# Patient Record
Sex: Male | Born: 1955 | Race: White | Hispanic: No | Marital: Married | Smoking: Former smoker
Health system: Southern US, Community
[De-identification: ages and names within clinical notes are randomized; demographics above are authoritative.]

## PROBLEM LIST (undated history)

## (undated) DIAGNOSIS — J45909 Unspecified asthma, uncomplicated: Secondary | ICD-10-CM

## (undated) DIAGNOSIS — K219 Gastro-esophageal reflux disease without esophagitis: Secondary | ICD-10-CM

## (undated) DIAGNOSIS — K5792 Diverticulitis of intestine, part unspecified, without perforation or abscess without bleeding: Secondary | ICD-10-CM

## (undated) DIAGNOSIS — G40409 Other generalized epilepsy and epileptic syndromes, not intractable, without status epilepticus: Secondary | ICD-10-CM

## (undated) DIAGNOSIS — C649 Malignant neoplasm of unspecified kidney, except renal pelvis: Secondary | ICD-10-CM

## (undated) DIAGNOSIS — N289 Disorder of kidney and ureter, unspecified: Secondary | ICD-10-CM

## (undated) HISTORY — DX: Disorder of kidney and ureter, unspecified: N28.9

---

## 2005-06-26 ENCOUNTER — Inpatient Hospital Stay: Payer: Self-pay | Admitting: Surgery

## 2005-07-10 ENCOUNTER — Inpatient Hospital Stay: Payer: Self-pay | Admitting: Surgery

## 2005-08-03 ENCOUNTER — Ambulatory Visit: Payer: Self-pay | Admitting: Surgery

## 2007-04-18 ENCOUNTER — Ambulatory Visit: Payer: Self-pay | Admitting: Unknown Physician Specialty

## 2011-04-11 ENCOUNTER — Emergency Department: Payer: Self-pay | Admitting: *Deleted

## 2013-12-05 DIAGNOSIS — K5792 Diverticulitis of intestine, part unspecified, without perforation or abscess without bleeding: Secondary | ICD-10-CM | POA: Insufficient documentation

## 2013-12-05 DIAGNOSIS — G4700 Insomnia, unspecified: Secondary | ICD-10-CM | POA: Insufficient documentation

## 2013-12-05 DIAGNOSIS — J45909 Unspecified asthma, uncomplicated: Secondary | ICD-10-CM | POA: Insufficient documentation

## 2014-01-07 DIAGNOSIS — N401 Enlarged prostate with lower urinary tract symptoms: Secondary | ICD-10-CM

## 2014-01-07 DIAGNOSIS — N138 Other obstructive and reflux uropathy: Secondary | ICD-10-CM | POA: Insufficient documentation

## 2015-01-11 DIAGNOSIS — Z0000 Encounter for general adult medical examination without abnormal findings: Secondary | ICD-10-CM | POA: Insufficient documentation

## 2015-09-01 ENCOUNTER — Other Ambulatory Visit: Payer: Self-pay | Admitting: Physician Assistant

## 2015-09-01 ENCOUNTER — Ambulatory Visit
Admission: RE | Admit: 2015-09-01 | Discharge: 2015-09-01 | Disposition: A | Discharge status: Home / Self Care | Payer: BC Managed Care – PPO | Source: Ambulatory Visit | Attending: Physician Assistant | Admitting: Physician Assistant

## 2015-09-01 DIAGNOSIS — K760 Fatty (change of) liver, not elsewhere classified: Secondary | ICD-10-CM | POA: Diagnosis not present

## 2015-09-01 DIAGNOSIS — M549 Dorsalgia, unspecified: Secondary | ICD-10-CM | POA: Diagnosis present

## 2015-09-01 DIAGNOSIS — D3502 Benign neoplasm of left adrenal gland: Secondary | ICD-10-CM | POA: Insufficient documentation

## 2015-09-01 DIAGNOSIS — R791 Abnormal coagulation profile: Secondary | ICD-10-CM | POA: Insufficient documentation

## 2015-09-01 DIAGNOSIS — N281 Cyst of kidney, acquired: Secondary | ICD-10-CM | POA: Insufficient documentation

## 2015-09-01 DIAGNOSIS — R7989 Other specified abnormal findings of blood chemistry: Secondary | ICD-10-CM

## 2015-09-01 HISTORY — DX: Unspecified asthma, uncomplicated: J45.909

## 2015-09-01 MED ORDER — IOHEXOL 350 MG/ML SOLN
75.0000 mL | Freq: Once | INTRAVENOUS | Status: AC | PRN
  Administered 2015-09-01: 75 mL via INTRAVENOUS

## 2016-11-19 ENCOUNTER — Other Ambulatory Visit: Payer: Self-pay | Admitting: Internal Medicine

## 2016-11-19 DIAGNOSIS — E279 Disorder of adrenal gland, unspecified: Principal | ICD-10-CM

## 2016-11-19 DIAGNOSIS — E278 Other specified disorders of adrenal gland: Secondary | ICD-10-CM

## 2016-11-27 ENCOUNTER — Ambulatory Visit
Admission: RE | Admit: 2016-11-27 | Discharge: 2016-11-27 | Disposition: A | Discharge status: Home / Self Care | Payer: BC Managed Care – PPO | Source: Ambulatory Visit | Attending: Internal Medicine | Admitting: Internal Medicine

## 2016-11-27 DIAGNOSIS — E278 Other specified disorders of adrenal gland: Secondary | ICD-10-CM

## 2016-11-27 DIAGNOSIS — K5730 Diverticulosis of large intestine without perforation or abscess without bleeding: Secondary | ICD-10-CM | POA: Insufficient documentation

## 2016-11-27 DIAGNOSIS — E279 Disorder of adrenal gland, unspecified: Secondary | ICD-10-CM | POA: Insufficient documentation

## 2016-11-27 DIAGNOSIS — I700 Atherosclerosis of aorta: Secondary | ICD-10-CM | POA: Diagnosis not present

## 2016-11-27 DIAGNOSIS — N281 Cyst of kidney, acquired: Secondary | ICD-10-CM | POA: Diagnosis not present

## 2017-01-17 DIAGNOSIS — R739 Hyperglycemia, unspecified: Secondary | ICD-10-CM | POA: Insufficient documentation

## 2017-02-12 ENCOUNTER — Emergency Department: Payer: BC Managed Care – PPO

## 2017-02-12 ENCOUNTER — Encounter: Payer: Self-pay | Admitting: Emergency Medicine

## 2017-02-12 ENCOUNTER — Emergency Department
Admission: EM | Admit: 2017-02-12 | Discharge: 2017-02-12 | Disposition: A | Discharge status: Home / Self Care | Payer: BC Managed Care – PPO | Attending: Emergency Medicine | Admitting: Emergency Medicine

## 2017-02-12 DIAGNOSIS — J45909 Unspecified asthma, uncomplicated: Secondary | ICD-10-CM | POA: Diagnosis not present

## 2017-02-12 DIAGNOSIS — K5792 Diverticulitis of intestine, part unspecified, without perforation or abscess without bleeding: Secondary | ICD-10-CM

## 2017-02-12 DIAGNOSIS — Z79899 Other long term (current) drug therapy: Secondary | ICD-10-CM | POA: Diagnosis not present

## 2017-02-12 DIAGNOSIS — R1030 Lower abdominal pain, unspecified: Secondary | ICD-10-CM | POA: Diagnosis present

## 2017-02-12 DIAGNOSIS — K5732 Diverticulitis of large intestine without perforation or abscess without bleeding: Secondary | ICD-10-CM | POA: Diagnosis not present

## 2017-02-12 DIAGNOSIS — Z87891 Personal history of nicotine dependence: Secondary | ICD-10-CM | POA: Insufficient documentation

## 2017-02-12 HISTORY — DX: Diverticulitis of intestine, part unspecified, without perforation or abscess without bleeding: K57.92

## 2017-02-12 LAB — COMPREHENSIVE METABOLIC PANEL
ALK PHOS: 42 U/L (ref 38–126)
ALT: 29 U/L (ref 17–63)
ANION GAP: 7 (ref 5–15)
AST: 25 U/L (ref 15–41)
Albumin: 4.5 g/dL (ref 3.5–5.0)
BILIRUBIN TOTAL: 0.7 mg/dL (ref 0.3–1.2)
BUN: 15 mg/dL (ref 6–20)
CALCIUM: 9.2 mg/dL (ref 8.9–10.3)
CO2: 27 mmol/L (ref 22–32)
Chloride: 106 mmol/L (ref 101–111)
Creatinine, Ser: 1.08 mg/dL (ref 0.61–1.24)
GFR calc Af Amer: >60 mL/min (ref >60)
Glucose, Bld: 110 mg/dL — ABNORMAL HIGH (ref 65–99)
POTASSIUM: 4.2 mmol/L (ref 3.5–5.1)
Sodium: 140 mmol/L (ref 135–145)
TOTAL PROTEIN: 7.7 g/dL (ref 6.5–8.1)

## 2017-02-12 LAB — URINALYSIS, COMPLETE (UACMP) WITH MICROSCOPIC
BACTERIA UA: NONE SEEN
BILIRUBIN URINE: NEGATIVE
Glucose, UA: NEGATIVE mg/dL
Hgb urine dipstick: NEGATIVE
KETONES UR: 5 mg/dL — AB
LEUKOCYTES UA: NEGATIVE
Nitrite: NEGATIVE
PH: 6 (ref 5.0–8.0)
Protein, ur: NEGATIVE mg/dL
SPECIFIC GRAVITY, URINE: 1.017 (ref 1.005–1.030)
Squamous Epithelial / LPF: NONE SEEN

## 2017-02-12 LAB — CBC
HEMATOCRIT: 47 % (ref 40.0–52.0)
HEMOGLOBIN: 16 g/dL (ref 13.0–18.0)
MCH: 30.9 pg (ref 26.0–34.0)
MCHC: 34 g/dL (ref 32.0–36.0)
MCV: 90.7 fL (ref 80.0–100.0)
Platelets: 198 10*3/uL (ref 150–440)
RBC: 5.18 MIL/uL (ref 4.40–5.90)
RDW: 13.5 % (ref 11.5–14.5)
WBC: 6.6 10*3/uL (ref 3.8–10.6)

## 2017-02-12 LAB — LIPASE, BLOOD: Lipase: 22 U/L (ref 11–51)

## 2017-02-12 MED ORDER — METRONIDAZOLE 500 MG PO TABS
500.0000 mg | ORAL_TABLET | Freq: Once | ORAL | Status: AC
  Administered 2017-02-12: 500 mg via ORAL
  Filled 2017-02-12: qty 1

## 2017-02-12 MED ORDER — SULFAMETHOXAZOLE-TRIMETHOPRIM 800-160 MG PO TABS: 1.0000 | ORAL_TABLET | Freq: Two times a day (BID) | ORAL | 0 refills | Status: AC

## 2017-02-12 MED ORDER — METRONIDAZOLE 500 MG PO TABS
500.0000 mg | ORAL_TABLET | Freq: Three times a day (TID) | ORAL | 0 refills | Status: AC
Start: 2017-02-12 — End: 2017-02-22

## 2017-02-12 MED ORDER — IOPAMIDOL (ISOVUE-300) INJECTION 61%
100.0000 mL | Freq: Once | INTRAVENOUS | Status: AC | PRN
  Administered 2017-02-12: 100 mL via INTRAVENOUS

## 2017-02-12 MED ORDER — SULFAMETHOXAZOLE-TRIMETHOPRIM 800-160 MG PO TABS
1.0000 | ORAL_TABLET | Freq: Once | ORAL | Status: AC
  Administered 2017-02-12: 1 via ORAL
  Filled 2017-02-12: qty 1

## 2017-02-12 NOTE — ED Triage Notes (Signed)
LRQ pain that began on Sunday, and right groin pain, pt had moved furniture the day before, pain has lessened but still present. Denies N,V,D. Appears in NAD.

## 2017-02-12 NOTE — ED Notes (Signed)
Pt presents with right groin/lower abdominal pain since Sunday. States he had fever and chills on Sunday, which was the peak pain day. States it is better, "but something isn't right." Pt has hx of diverticulitis but it usually hurts on left side. Pt alert & oriented with NAD noted.

## 2017-02-12 NOTE — ED Provider Notes (Signed)
[facility] Emergency Department Provider Note  ____________________________________________  Time seen: Approximately 8:39 AM  I have reviewed the triage vital signs and the nursing notes.   HISTORY  Chief Complaint Abdominal Pain and Groin Pain   HPI Jonathon Lee is a 61 y.o. male with a history of asthma and diverticulitis who presents for evaluation of lower abdominal pain. Patient reports 2 days of dull constant right lower quadrant abdominal pain. He reports that the pain initially was very sharp and severe on day 1 located in his entire lower abdomen. Since then the pain is now a 2 out of 10, dull and has migrated to the right lower quadrant. He reports that one day before the pain started he was moving some heavy furniture around. Last BM this morning. No nausea or vomiting. 2 days ago patient had subjective fevers and chills however no longer having those. No dysuria or hematuria. No history of kidney stones. No purulence or swelling in his groin. No prior abdominal surgeries.  Past Medical History:  Diagnosis Date  . Asthma   . Diverticulitis     There are no active problems to display for this patient.   History reviewed. No pertinent surgical history.  Prior to Admission medications   Medication Sig Start Date End Date Taking? Authorizing Provider  albuterol (PROVENTIL HFA;VENTOLIN HFA) 108 (90 Base) MCG/ACT inhaler Inhale 2 puffs into the lungs every 4 (four) hours as needed for wheezing. 01/17/17  Yes [provider]  amitriptyline (ELAVIL) 50 MG tablet Take 50 mg by mouth at bedtime. 01/04/17  Yes [provider]  Fluticasone-Salmeterol (ADVAIR DISKUS) 250-50 MCG/DOSE AEPB Inhale 1 puff into the lungs every 12 (twelve) hours. 01/17/17  Yes [provider]  metroNIDAZOLE (FLAGYL) 500 MG tablet Take 1 tablet (500 mg total) by mouth 3 (three) times daily. 02/12/17 02/22/17  Rudene Re, MD    sulfamethoxazole-trimethoprim (BACTRIM DS,SEPTRA DS) 800-160 MG tablet Take 1 tablet by mouth 2 (two) times daily. 02/12/17 02/22/17  Rudene Re, MD    Allergies Zosyn [piperacillin sod-tazobactam so]  No family history on file.  Social History Social History  Substance Use Topics  . Smoking status: Former Research scientist (life sciences)  . Smokeless tobacco: Never Used  . Alcohol use No    Review of Systems  Constitutional: + fever. Eyes: Negative for visual changes. ENT: Negative for sore throat. Neck: No neck pain  Cardiovascular: Negative for chest pain. Respiratory: Negative for shortness of breath. Gastrointestinal: + RLQ abdominal pain. No vomiting or diarrhea. Genitourinary: Negative for dysuria. Musculoskeletal: Negative for back pain. Skin: Negative for rash. Neurological: Negative for headaches, weakness or numbness. Psych: No SI or HI  ____________________________________________   PHYSICAL EXAM:  VITAL SIGNS: ED Triage Vitals  Enc Vitals Group     BP 02/12/17 0827 127/72     Pulse Rate 02/12/17 0827 82     Resp 02/12/17 0827 17     Temp 02/12/17 0827 98.3 F (36.8 C)     Temp Source 02/12/17 0827 Oral     SpO2 02/12/17 0827 97 %     Weight 02/12/17 0823 213 lb (96.6 kg)     Height 02/12/17 0823 5\' 8"  (1.727 m)     Head Circumference --      Peak Flow --      Pain Score 02/12/17 0823 2     Pain Loc --      Pain Edu? --      Excl. in [place]? --  Constitutional: Alert and oriented. Well appearing and in no apparent distress. HEENT:      Head: Normocephalic and atraumatic.         Eyes: Conjunctivae are normal. Sclera is non-icteric.       Mouth/Throat: Mucous membranes are moist.       Neck: Supple with no signs of meningismus. Cardiovascular: Regular rate and rhythm. No murmurs, gallops, or rubs. 2+ symmetrical distal pulses are present in all extremities. No JVD. Respiratory: Normal respiratory effort. Lungs are clear to auscultation bilaterally. No wheezes,  crackles, or rhonchi.  Gastrointestinal: Soft, ttp over the RLQ, non distended with positive bowel sounds. No rebound or guarding. Genitourinary: No CVA tenderness. Bilateral testicles are descended with no tenderness to palpation, bilateral positive cremasteric reflexes are present, no swelling or erythema of the scrotum. No evidence of inguinal hernia. Musculoskeletal: Nontender with normal range of motion in all extremities. No edema, cyanosis, or erythema of extremities. Neurologic: Normal speech and language. Face is symmetric. Moving all extremities. No gross focal neurologic deficits are appreciated. Skin: Skin is warm, dry and intact. No rash noted. Psychiatric: Mood and affect are normal. Speech and behavior are normal.  ____________________________________________   LABS (all labs ordered are listed, but only abnormal results are displayed)  Labs Reviewed  COMPREHENSIVE METABOLIC PANEL - Abnormal; Notable for the following:       Result Value   Glucose, Bld 110 (*)    All other components within normal limits  URINALYSIS, COMPLETE (UACMP) WITH MICROSCOPIC - Abnormal; Notable for the following:    Color, Urine YELLOW (*)    APPearance CLEAR (*)    Ketones, ur 5 (*)    All other components within normal limits  LIPASE, BLOOD  CBC   ____________________________________________  EKG  none ____________________________________________  RADIOLOGY  CT a/p: Acute colonic diverticulitis. Inflammation involving the sigmoid colon in the lower mid abdomen. No evidence for an abscess collection.  Indeterminate right renal lesion measuring up to 1.7 cm. This structure was hyperdense on a previous noncontrast study and this may represent a proteinaceous or hemorrhagic cyst but indeterminate. This lesion could be more definitively characterized with a pre and post contrast MRI.  Right inguinal hernia.  Hepatic steatosis.  Bilateral renal  cysts. ____________________________________________   PROCEDURES  Procedure(s) performed: None Procedures Critical Care performed:  None ____________________________________________   INITIAL IMPRESSION / ASSESSMENT AND PLAN / ED COURSE   61 y.o. male with a history of asthma and diverticulitis who presents for evaluation of lower abdominal pain x 2 days. Patient well-appearing, in no distress, has normal vital signs, abdomen soft with right lower quadrant tenderness to palpation. Normal GU exam with no evidence of torsion or hernia. Differential diagnoses including appendicitis versus diverticulitis versus kidney stone. Plan for CBC, CMP, lipase, urinalysis, and CT abdomen and pelvis.    _________________________ 11:07 AM on 02/12/2017 -----------------------------------------  CT concerning for acute diverticulitis with no abscess or perforation. Patient was started on Bactrim and Flagyl and prescription to be discharged home on a 10 day course. He remains well appearing. Discussed return precautions with patient. Also discussed recommendations for further monitoring of the cyst seen in his right kidney. I told patient that radiologist recommended an MRI for further investigation and that it can be done by the patient's primary care doctor. Patient will discuss that with his primary care doctor.  Pertinent labs & imaging results that were available during my care of the patient were reviewed by me and considered in my  medical decision making (see chart for details).    ____________________________________________   FINAL CLINICAL IMPRESSION(S) / ED DIAGNOSES  Final diagnoses:  Diverticulitis      NEW MEDICATIONS STARTED DURING THIS VISIT:  New Prescriptions   METRONIDAZOLE (FLAGYL) 500 MG TABLET    Take 1 tablet (500 mg total) by mouth 3 (three) times daily.   SULFAMETHOXAZOLE-TRIMETHOPRIM (BACTRIM DS,SEPTRA DS) 800-160 MG TABLET    Take 1 tablet by mouth 2 (two) times  daily.     Note:  This document was prepared using Dragon voice recognition software and may include unintentional dictation errors.    Alfred Levins, [place], MD 02/12/17 [phone]

## 2017-02-12 NOTE — ED Notes (Signed)
Pt discharged home after verbalizing understanding of discharge instructions; nad noted. 

## 2017-12-16 ENCOUNTER — Inpatient Hospital Stay
Admission: EM | Admit: 2017-12-16 | Discharge: 2017-12-31 | DRG: 330 | Disposition: A | Discharge status: Home Health | Payer: BC Managed Care – PPO | Attending: Surgery | Admitting: Surgery

## 2017-12-16 ENCOUNTER — Encounter: Payer: Self-pay | Admitting: Emergency Medicine

## 2017-12-16 ENCOUNTER — Emergency Department: Payer: BC Managed Care – PPO

## 2017-12-16 ENCOUNTER — Other Ambulatory Visit: Payer: Self-pay

## 2017-12-16 ENCOUNTER — Emergency Department: Payer: BC Managed Care – PPO | Admitting: Anesthesiology

## 2017-12-16 ENCOUNTER — Encounter
Admission: EM | Disposition: A | Discharge status: Home Health | Payer: Self-pay | Source: Home / Self Care | Attending: Surgery

## 2017-12-16 DIAGNOSIS — J45909 Unspecified asthma, uncomplicated: Secondary | ICD-10-CM | POA: Diagnosis present

## 2017-12-16 DIAGNOSIS — Z6832 Body mass index (BMI) 32.0-32.9, adult: Secondary | ICD-10-CM

## 2017-12-16 DIAGNOSIS — J90 Pleural effusion, not elsewhere classified: Secondary | ICD-10-CM | POA: Diagnosis not present

## 2017-12-16 DIAGNOSIS — N289 Disorder of kidney and ureter, unspecified: Secondary | ICD-10-CM | POA: Diagnosis present

## 2017-12-16 DIAGNOSIS — Z87891 Personal history of nicotine dependence: Secondary | ICD-10-CM

## 2017-12-16 DIAGNOSIS — Z7951 Long term (current) use of inhaled steroids: Secondary | ICD-10-CM

## 2017-12-16 DIAGNOSIS — K219 Gastro-esophageal reflux disease without esophagitis: Secondary | ICD-10-CM | POA: Diagnosis present

## 2017-12-16 DIAGNOSIS — R0602 Shortness of breath: Secondary | ICD-10-CM

## 2017-12-16 DIAGNOSIS — K567 Ileus, unspecified: Secondary | ICD-10-CM | POA: Diagnosis not present

## 2017-12-16 DIAGNOSIS — J9811 Atelectasis: Secondary | ICD-10-CM | POA: Diagnosis not present

## 2017-12-16 DIAGNOSIS — Z79899 Other long term (current) drug therapy: Secondary | ICD-10-CM | POA: Diagnosis not present

## 2017-12-16 DIAGNOSIS — K08409 Partial loss of teeth, unspecified cause, unspecified class: Secondary | ICD-10-CM | POA: Diagnosis present

## 2017-12-16 DIAGNOSIS — Z880 Allergy status to penicillin: Secondary | ICD-10-CM

## 2017-12-16 DIAGNOSIS — K5720 Diverticulitis of large intestine with perforation and abscess without bleeding: Principal | ICD-10-CM | POA: Diagnosis present

## 2017-12-16 DIAGNOSIS — E871 Hypo-osmolality and hyponatremia: Secondary | ICD-10-CM | POA: Diagnosis not present

## 2017-12-16 HISTORY — PX: LAPAROTOMY: SHX154

## 2017-12-16 LAB — COMPREHENSIVE METABOLIC PANEL
ALBUMIN: 3.8 g/dL (ref 3.5–5.0)
ALK PHOS: 44 U/L (ref 38–126)
ALT: 17 U/L (ref 17–63)
AST: 19 U/L (ref 15–41)
Anion gap: 9 (ref 5–15)
BUN: 19 mg/dL (ref 6–20)
CALCIUM: 8.5 mg/dL — AB (ref 8.9–10.3)
CO2: 24 mmol/L (ref 22–32)
CREATININE: 1.22 mg/dL (ref 0.61–1.24)
Chloride: 103 mmol/L (ref 101–111)
GFR calc Af Amer: >60 mL/min (ref >60)
GLUCOSE: 129 mg/dL — AB (ref 65–99)
Potassium: 4.1 mmol/L (ref 3.5–5.1)
Sodium: 136 mmol/L (ref 135–145)
Total Bilirubin: 0.8 mg/dL (ref 0.3–1.2)
Total Protein: 7.5 g/dL (ref 6.5–8.1)

## 2017-12-16 LAB — URINALYSIS, COMPLETE (UACMP) WITH MICROSCOPIC
BILIRUBIN URINE: NEGATIVE
GLUCOSE, UA: NEGATIVE mg/dL
HGB URINE DIPSTICK: NEGATIVE
Ketones, ur: 20 mg/dL — AB
Nitrite: NEGATIVE
PH: 6 (ref 5.0–8.0)
Protein, ur: 30 mg/dL — AB
SPECIFIC GRAVITY, URINE: 1.026 (ref 1.005–1.030)

## 2017-12-16 LAB — TYPE AND SCREEN
ABO/RH(D): O POS
ANTIBODY SCREEN: NEGATIVE

## 2017-12-16 LAB — CBC
HCT: 46.4 % (ref 40.0–52.0)
Hemoglobin: 16 g/dL (ref 13.0–18.0)
MCH: 31.6 pg (ref 26.0–34.0)
MCHC: 34.6 g/dL (ref 32.0–36.0)
MCV: 91.5 fL (ref 80.0–100.0)
PLATELETS: 231 10*3/uL (ref 150–440)
RBC: 5.07 MIL/uL (ref 4.40–5.90)
RDW: 13.7 % (ref 11.5–14.5)
WBC: 10.9 10*3/uL — ABNORMAL HIGH (ref 3.8–10.6)

## 2017-12-16 LAB — LIPASE, BLOOD: Lipase: 19 U/L (ref 11–51)

## 2017-12-16 SURGERY — LAPAROTOMY, EXPLORATORY
Anesthesia: General | Site: Abdomen | Wound class: Contaminated

## 2017-12-16 MED ORDER — ONDANSETRON HCL 4 MG/2ML IJ SOLN
4.0000 mg | Freq: Four times a day (QID) | INTRAMUSCULAR | Status: DC | PRN
  Administered 2017-12-17 – 2017-12-22 (×6): 4 mg via INTRAVENOUS
  Filled 2017-12-16 (×7): qty 2

## 2017-12-16 MED ORDER — HYDRALAZINE HCL 20 MG/ML IJ SOLN: 10.0000 mg | INTRAMUSCULAR | Status: DC | PRN

## 2017-12-16 MED ORDER — IOHEXOL 300 MG/ML  SOLN
100.0000 mL | Freq: Once | INTRAMUSCULAR | Status: AC | PRN
  Administered 2017-12-16: 100 mL via INTRAVENOUS

## 2017-12-16 MED ORDER — ENOXAPARIN SODIUM 40 MG/0.4ML ~~LOC~~ SOLN
40.0000 mg | SUBCUTANEOUS | Status: DC
  Administered 2017-12-17 – 2017-12-30 (×14): 40 mg via SUBCUTANEOUS
  Filled 2017-12-16 (×14): qty 0.4

## 2017-12-16 MED ORDER — ROCURONIUM BROMIDE 50 MG/5ML IV SOLN
INTRAVENOUS | Status: AC
  Filled 2017-12-16: qty 1

## 2017-12-16 MED ORDER — LIDOCAINE HCL (CARDIAC) PF 100 MG/5ML IV SOSY
PREFILLED_SYRINGE | INTRAVENOUS | Status: DC | PRN
  Administered 2017-12-16: 60 mg via INTRAVENOUS

## 2017-12-16 MED ORDER — ONDANSETRON HCL 4 MG/2ML IJ SOLN
INTRAMUSCULAR | Status: AC
  Administered 2017-12-16: 4 mg via INTRAVENOUS
  Filled 2017-12-16: qty 2

## 2017-12-16 MED ORDER — METRONIDAZOLE IN NACL 5-0.79 MG/ML-% IV SOLN
500.0000 mg | Freq: Three times a day (TID) | INTRAVENOUS | Status: DC
  Administered 2017-12-17 – 2017-12-31 (×43): 500 mg via INTRAVENOUS
  Filled 2017-12-16 (×45): qty 100

## 2017-12-16 MED ORDER — ONDANSETRON 4 MG PO TBDP
4.0000 mg | ORAL_TABLET | Freq: Four times a day (QID) | ORAL | Status: DC | PRN
  Administered 2017-12-24: 4 mg via ORAL
  Filled 2017-12-16: qty 1

## 2017-12-16 MED ORDER — HYDROMORPHONE HCL 1 MG/ML IJ SOLN
INTRAMUSCULAR | Status: AC
  Administered 2017-12-16: 1 mg via INTRAVENOUS
  Filled 2017-12-16: qty 1

## 2017-12-16 MED ORDER — SUCCINYLCHOLINE CHLORIDE 20 MG/ML IJ SOLN
INTRAMUSCULAR | Status: AC
  Filled 2017-12-16: qty 1

## 2017-12-16 MED ORDER — OXYCODONE HCL 5 MG/5ML PO SOLN: 5.0000 mg | Freq: Once | ORAL | Status: DC | PRN

## 2017-12-16 MED ORDER — METRONIDAZOLE IN NACL 5-0.79 MG/ML-% IV SOLN
500.0000 mg | Freq: Once | INTRAVENOUS | Status: AC
  Administered 2017-12-16: 500 mg via INTRAVENOUS
  Filled 2017-12-16: qty 100

## 2017-12-16 MED ORDER — MORPHINE SULFATE (PF) 4 MG/ML IV SOLN
6.0000 mg | Freq: Once | INTRAVENOUS | Status: AC
  Administered 2017-12-16: 6 mg via INTRAVENOUS
  Filled 2017-12-16: qty 2

## 2017-12-16 MED ORDER — OXYCODONE-ACETAMINOPHEN 5-325 MG PO TABS
1.0000 | ORAL_TABLET | ORAL | Status: DC | PRN
  Administered 2017-12-16: 1 via ORAL
  Filled 2017-12-16: qty 1

## 2017-12-16 MED ORDER — FENTANYL CITRATE (PF) 100 MCG/2ML IJ SOLN
INTRAMUSCULAR | Status: AC
  Administered 2017-12-16: 25 ug via INTRAVENOUS
  Filled 2017-12-16: qty 2

## 2017-12-16 MED ORDER — CIPROFLOXACIN IN D5W 400 MG/200ML IV SOLN
400.0000 mg | Freq: Two times a day (BID) | INTRAVENOUS | Status: DC
  Administered 2017-12-17 – 2017-12-31 (×29): 400 mg via INTRAVENOUS
  Filled 2017-12-16 (×32): qty 200

## 2017-12-16 MED ORDER — MIDAZOLAM HCL 2 MG/2ML IJ SOLN
INTRAMUSCULAR | Status: DC | PRN
  Administered 2017-12-16: 2 mg via INTRAVENOUS

## 2017-12-16 MED ORDER — FENTANYL CITRATE (PF) 100 MCG/2ML IJ SOLN
INTRAMUSCULAR | Status: DC | PRN
  Administered 2017-12-16: 50 ug via INTRAVENOUS
  Administered 2017-12-16: 25 ug via INTRAVENOUS
  Administered 2017-12-16: 50 ug via INTRAVENOUS
  Administered 2017-12-16: 25 ug via INTRAVENOUS
  Administered 2017-12-16: 100 ug via INTRAVENOUS
  Administered 2017-12-16: 50 ug via INTRAVENOUS

## 2017-12-16 MED ORDER — BUPIVACAINE-EPINEPHRINE 0.25% -1:200000 IJ SOLN
INTRAMUSCULAR | Status: DC | PRN
Start: 2017-12-16 — End: 2017-12-16
  Administered 2017-12-16: 30 mL

## 2017-12-16 MED ORDER — PROPOFOL 10 MG/ML IV BOLUS
INTRAVENOUS | Status: AC
Start: 2017-12-16 — End: 2017-12-16
  Filled 2017-12-16: qty 20

## 2017-12-16 MED ORDER — SODIUM CHLORIDE 0.9 % IV BOLUS
500.0000 mL | Freq: Once | INTRAVENOUS | Status: AC
  Administered 2017-12-16: 500 mL via INTRAVENOUS

## 2017-12-16 MED ORDER — CIPROFLOXACIN IN D5W 400 MG/200ML IV SOLN
400.0000 mg | Freq: Once | INTRAVENOUS | Status: AC
  Administered 2017-12-16: 400 mg via INTRAVENOUS
  Filled 2017-12-16: qty 200

## 2017-12-16 MED ORDER — MIDAZOLAM HCL 2 MG/2ML IJ SOLN
INTRAMUSCULAR | Status: AC
  Filled 2017-12-16: qty 2

## 2017-12-16 MED ORDER — LIDOCAINE HCL (PF) 2 % IJ SOLN
INTRAMUSCULAR | Status: AC
  Filled 2017-12-16: qty 10

## 2017-12-16 MED ORDER — SUGAMMADEX SODIUM 200 MG/2ML IV SOLN
INTRAVENOUS | Status: DC | PRN
  Administered 2017-12-16: 200 mg via INTRAVENOUS

## 2017-12-16 MED ORDER — SODIUM CHLORIDE 0.9 % IV BOLUS
1000.0000 mL | Freq: Once | INTRAVENOUS | Status: AC
  Administered 2017-12-16: 1000 mL via INTRAVENOUS

## 2017-12-16 MED ORDER — HYDROMORPHONE HCL 1 MG/ML IJ SOLN
1.0000 mg | Freq: Once | INTRAMUSCULAR | Status: AC
  Administered 2017-12-16: 1 mg via INTRAVENOUS

## 2017-12-16 MED ORDER — PROCHLORPERAZINE MALEATE 10 MG PO TABS
10.0000 mg | ORAL_TABLET | Freq: Four times a day (QID) | ORAL | Status: DC | PRN
  Filled 2017-12-16: qty 1

## 2017-12-16 MED ORDER — MORPHINE SULFATE (PF) 4 MG/ML IV SOLN
4.0000 mg | Freq: Once | INTRAVENOUS | Status: AC
  Administered 2017-12-16: 4 mg via INTRAVENOUS

## 2017-12-16 MED ORDER — METRONIDAZOLE IN NACL 5-0.79 MG/ML-% IV SOLN
500.0000 mg | Freq: Once | INTRAVENOUS | Status: DC
  Filled 2017-12-16: qty 100

## 2017-12-16 MED ORDER — METRONIDAZOLE IN NACL 5-0.79 MG/ML-% IV SOLN
INTRAVENOUS | Status: DC | PRN
  Administered 2017-12-16: 500 mg via INTRAVENOUS

## 2017-12-16 MED ORDER — ROCURONIUM BROMIDE 100 MG/10ML IV SOLN
INTRAVENOUS | Status: DC | PRN
  Administered 2017-12-16: 50 mg via INTRAVENOUS
  Administered 2017-12-16: 20 mg via INTRAVENOUS

## 2017-12-16 MED ORDER — DEXTROSE IN LACTATED RINGERS 5 % IV SOLN
INTRAVENOUS | Status: DC
  Administered 2017-12-16 – 2017-12-18 (×5): via INTRAVENOUS

## 2017-12-16 MED ORDER — ONDANSETRON 4 MG PO TBDP
ORAL_TABLET | ORAL | Status: AC
  Filled 2017-12-16: qty 1

## 2017-12-16 MED ORDER — SUGAMMADEX SODIUM 200 MG/2ML IV SOLN
INTRAVENOUS | Status: AC
  Filled 2017-12-16: qty 2

## 2017-12-16 MED ORDER — SODIUM CHLORIDE 0.9 % IV SOLN
INTRAVENOUS | Status: DC | PRN
  Administered 2017-12-16: 70 mL

## 2017-12-16 MED ORDER — FENTANYL CITRATE (PF) 250 MCG/5ML IJ SOLN
INTRAMUSCULAR | Status: AC
  Filled 2017-12-16: qty 5

## 2017-12-16 MED ORDER — KETOROLAC TROMETHAMINE 30 MG/ML IJ SOLN
30.0000 mg | Freq: Four times a day (QID) | INTRAMUSCULAR | Status: AC
  Administered 2017-12-17 – 2017-12-21 (×20): 30 mg via INTRAVENOUS
  Filled 2017-12-16 (×20): qty 1

## 2017-12-16 MED ORDER — ONDANSETRON HCL 4 MG/2ML IJ SOLN
4.0000 mg | Freq: Once | INTRAMUSCULAR | Status: AC
  Administered 2017-12-16: 4 mg via INTRAVENOUS

## 2017-12-16 MED ORDER — ONDANSETRON 4 MG PO TBDP
4.0000 mg | ORAL_TABLET | Freq: Once | ORAL | Status: AC
  Administered 2017-12-16: 4 mg via ORAL

## 2017-12-16 MED ORDER — OXYCODONE-ACETAMINOPHEN 5-325 MG PO TABS
ORAL_TABLET | ORAL | Status: AC
  Filled 2017-12-16: qty 1

## 2017-12-16 MED ORDER — ACETAMINOPHEN 10 MG/ML IV SOLN
1000.0000 mg | Freq: Four times a day (QID) | INTRAVENOUS | Status: AC
  Administered 2017-12-17 (×4): 1000 mg via INTRAVENOUS
  Filled 2017-12-16 (×4): qty 100

## 2017-12-16 MED ORDER — ONDANSETRON HCL 4 MG/2ML IJ SOLN
INTRAMUSCULAR | Status: DC | PRN
  Administered 2017-12-16: 4 mg via INTRAVENOUS

## 2017-12-16 MED ORDER — FENTANYL CITRATE (PF) 100 MCG/2ML IJ SOLN
25.0000 ug | INTRAMUSCULAR | Status: DC | PRN
  Administered 2017-12-16 (×4): 25 ug via INTRAVENOUS
  Administered 2017-12-16: 50 ug via INTRAVENOUS

## 2017-12-16 MED ORDER — SUGAMMADEX SODIUM 500 MG/5ML IV SOLN
INTRAVENOUS | Status: AC
  Filled 2017-12-16: qty 5

## 2017-12-16 MED ORDER — MORPHINE SULFATE (PF) 4 MG/ML IV SOLN
INTRAVENOUS | Status: AC
  Administered 2017-12-16: 4 mg via INTRAVENOUS
  Filled 2017-12-16: qty 1

## 2017-12-16 MED ORDER — SODIUM CHLORIDE 0.9 % IJ SOLN
INTRAMUSCULAR | Status: AC
  Filled 2017-12-16: qty 50

## 2017-12-16 MED ORDER — PROCHLORPERAZINE EDISYLATE 10 MG/2ML IJ SOLN
5.0000 mg | Freq: Four times a day (QID) | INTRAMUSCULAR | Status: DC | PRN
  Filled 2017-12-16: qty 2

## 2017-12-16 MED ORDER — MORPHINE SULFATE (PF) 4 MG/ML IV SOLN
4.0000 mg | INTRAVENOUS | Status: DC | PRN
  Administered 2017-12-18 – 2017-12-19 (×6): 4 mg via INTRAVENOUS
  Filled 2017-12-16 (×6): qty 1

## 2017-12-16 MED ORDER — FENTANYL CITRATE (PF) 100 MCG/2ML IJ SOLN
INTRAMUSCULAR | Status: AC
  Filled 2017-12-16: qty 2

## 2017-12-16 MED ORDER — LACTATED RINGERS IV SOLN
INTRAVENOUS | Status: DC | PRN
  Administered 2017-12-16 (×2): via INTRAVENOUS

## 2017-12-16 MED ORDER — HYDROMORPHONE HCL 1 MG/ML IJ SOLN
1.0000 mg | Freq: Once | INTRAMUSCULAR | Status: AC
  Administered 2017-12-16: 1 mg via INTRAVENOUS
  Filled 2017-12-16: qty 1

## 2017-12-16 MED ORDER — SUCCINYLCHOLINE CHLORIDE 20 MG/ML IJ SOLN
INTRAMUSCULAR | Status: DC | PRN
  Administered 2017-12-16: 100 mg via INTRAVENOUS

## 2017-12-16 MED ORDER — BUPIVACAINE LIPOSOME 1.3 % IJ SUSP
INTRAMUSCULAR | Status: AC
Start: 2017-12-16 — End: 2017-12-16
  Filled 2017-12-16: qty 20

## 2017-12-16 MED ORDER — LACTATED RINGERS IV SOLN
INTRAVENOUS | Status: DC | PRN
  Administered 2017-12-16: 19:00:00 via INTRAVENOUS

## 2017-12-16 MED ORDER — PROPOFOL 10 MG/ML IV BOLUS
INTRAVENOUS | Status: DC | PRN
  Administered 2017-12-16: 150 mg via INTRAVENOUS

## 2017-12-16 MED ORDER — OXYCODONE HCL 5 MG PO TABS: 5.0000 mg | ORAL_TABLET | Freq: Once | ORAL | Status: DC | PRN

## 2017-12-16 MED ORDER — FAMOTIDINE IN NACL 20-0.9 MG/50ML-% IV SOLN
20.0000 mg | Freq: Two times a day (BID) | INTRAVENOUS | Status: DC
  Administered 2017-12-16 – 2017-12-31 (×30): 20 mg via INTRAVENOUS
  Filled 2017-12-16 (×30): qty 50

## 2017-12-16 MED ORDER — ONDANSETRON HCL 4 MG/2ML IJ SOLN
INTRAMUSCULAR | Status: AC
  Filled 2017-12-16: qty 2

## 2017-12-16 MED ORDER — HYDROMORPHONE HCL 1 MG/ML IJ SOLN: 0.2500 mg | INTRAMUSCULAR | Status: DC | PRN

## 2017-12-16 MED ORDER — FENTANYL CITRATE (PF) 100 MCG/2ML IJ SOLN
INTRAMUSCULAR | Status: AC
  Administered 2017-12-16: 50 ug via INTRAVENOUS
  Filled 2017-12-16: qty 2

## 2017-12-16 MED ORDER — PHENYLEPHRINE HCL 10 MG/ML IJ SOLN
INTRAMUSCULAR | Status: DC | PRN
  Administered 2017-12-16: 100 ug via INTRAVENOUS

## 2017-12-16 SURGICAL SUPPLY — 50 items
ADHESIVE MASTISOL STRL (MISCELLANEOUS) ×6 IMPLANT
BARRIER SKIN 2 1/4 (WOUND CARE) ×2 IMPLANT
BARRIER SKIN 2 1/4INCH (WOUND CARE) ×1
BNDG GAUZE 4.5X4.1 6PLY STRL (MISCELLANEOUS) ×3 IMPLANT
BULB RESERV EVAC DRAIN JP 100C (MISCELLANEOUS) ×6 IMPLANT
CANISTER SUCT 1200ML W/VALVE (MISCELLANEOUS) IMPLANT
CANISTER SUCT 3000ML (MISCELLANEOUS) ×6 IMPLANT
CHLORAPREP W/TINT 26ML (MISCELLANEOUS) ×3 IMPLANT
DRAIN CHANNEL JP 19F (MISCELLANEOUS) ×6 IMPLANT
DRAPE LAPAROTOMY 100X77 ABD (DRAPES) ×3 IMPLANT
DRSG TELFA 3X8 NADH (GAUZE/BANDAGES/DRESSINGS) IMPLANT
ELECT BLADE 6.5 EXT (BLADE) ×3 IMPLANT
ELECT CAUTERY BLADE 6.4 (BLADE) ×3 IMPLANT
ELECT REM PT RETURN 9FT ADLT (ELECTROSURGICAL) ×3
ELECTRODE REM PT RTRN 9FT ADLT (ELECTROSURGICAL) ×1 IMPLANT
GAUZE SPONGE 4X4 12PLY STRL (GAUZE/BANDAGES/DRESSINGS) IMPLANT
GLOVE BIO SURGEON STRL SZ8 (GLOVE) ×21 IMPLANT
GOWN STRL REUS W/ TWL LRG LVL3 (GOWN DISPOSABLE) ×3 IMPLANT
GOWN STRL REUS W/TWL LRG LVL3 (GOWN DISPOSABLE) ×6
KIT TURNOVER KIT A (KITS) ×3 IMPLANT
LABEL OR SOLS (LABEL) IMPLANT
LIGASURE IMPACT 36 18CM CVD LR (INSTRUMENTS) ×3 IMPLANT
NEEDLE HYPO 22GX1.5 SAFETY (NEEDLE) ×6 IMPLANT
NS IRRIG 1000ML POUR BTL (IV SOLUTION) ×12 IMPLANT
PACK BASIN MAJOR ARMC (MISCELLANEOUS) ×3 IMPLANT
PACK COLON CLEAN CLOSURE (MISCELLANEOUS) IMPLANT
POUCH DRAIN  2 1/4 MED RED 181 (OSTOMY) ×3 IMPLANT
SEPRAFILM MEMBRANE 5X6 (MISCELLANEOUS) IMPLANT
SLEEVE SCD COMPRESS THIGH MED (MISCELLANEOUS) ×3 IMPLANT
SPONGE DRAIN TRACH 4X4 STRL 2S (GAUZE/BANDAGES/DRESSINGS) ×6 IMPLANT
SPONGE LAP 18X18 5 PK (GAUZE/BANDAGES/DRESSINGS) ×15 IMPLANT
STAPLER CUT CVD 40MM BLUE (STAPLE) ×3 IMPLANT
STAPLER CUT RELOAD BLUE (STAPLE) ×3 IMPLANT
STAPLER SKIN PROX 35W (STAPLE) ×3 IMPLANT
SUT ETHILON 3-0 FS-10 30 BLK (SUTURE) ×3
SUT PDS AB 0 CT1 27 (SUTURE) ×9 IMPLANT
SUT PDS AB 1 TP1 54 (SUTURE) ×6 IMPLANT
SUT PROLENE 0 CT 1 30 (SUTURE) ×12 IMPLANT
SUT SILK 2 0SH CR/8 30 (SUTURE) ×3 IMPLANT
SUT SILK 3-0 (SUTURE) ×3 IMPLANT
SUT VIC AB 3-0 SH 27 (SUTURE) ×2
SUT VIC AB 3-0 SH 27X BRD (SUTURE) ×1 IMPLANT
SUT VIC AB 3-0 SH 8-18 (SUTURE) ×3 IMPLANT
SUT VICRYL 2 0 18  UND BR (SUTURE) ×2
SUT VICRYL 2 0 18 UND BR (SUTURE) ×1 IMPLANT
SUT VICRYL 3-0 CR8 SH (SUTURE) ×3 IMPLANT
SUTURE EHLN 3-0 FS-10 30 BLK (SUTURE) ×1 IMPLANT
SYR 10ML LL (SYRINGE) IMPLANT
SYR 20CC LL (SYRINGE) ×6 IMPLANT
TRAY FOLEY W/METER SILVER 16FR (SET/KITS/TRAYS/PACK) ×3 IMPLANT

## 2017-12-16 NOTE — ED Triage Notes (Signed)
Pt to ed with c/o abd pain acute onset this am.  Pt states he was seen at [facility] on Saturday afternoon for diverticulitis.  Started on abx and states he felt better this am until acute onset of sharp pain in mid abd.

## 2017-12-16 NOTE — Transfer of Care (Signed)
Immediate Anesthesia Transfer of Care Note  Patient: Jonathon Lee  Procedure(s) Performed: EXPLORATORY LAPAROTOMY/ SIGMOID COLECTOMY, colostomy (N/A Abdomen)  Patient Location: PACU  Anesthesia Type:General  Level of Consciousness: awake and patient cooperative  Airway & Oxygen Therapy: Patient Spontanous Breathing  Post-op Assessment: Report given to RN and Post -op Vital signs reviewed and stable  Post vital signs: Reviewed and stable  Last Vitals:  Vitals Value Taken Time  BP    Temp    Pulse 123 12/16/2017  9:42 PM  Resp    SpO2 94 % 12/16/2017  9:42 PM  Vitals shown include unvalidated device data.  Last Pain:  Vitals:   12/16/17 1747  TempSrc:   PainSc: 7          Complications: No apparent anesthesia complications

## 2017-12-16 NOTE — Anesthesia Postprocedure Evaluation (Signed)
Anesthesia Post Note  Patient: Jonathon Lee  Procedure(s) Performed: EXPLORATORY LAPAROTOMY/ SIGMOID COLECTOMY, colostomy (N/A Abdomen)  Patient location during evaluation: PACU Anesthesia Type: General Level of consciousness: awake and alert Pain management: pain level controlled Vital Signs Assessment: post-procedure vital signs reviewed and stable Respiratory status: spontaneous breathing, nonlabored ventilation, respiratory function stable and patient connected to nasal cannula oxygen Cardiovascular status: blood pressure returned to baseline and stable Postop Assessment: no apparent nausea or vomiting Anesthetic complications: no     Last Vitals:  Vitals:   12/16/17 2240 12/16/17 2245  BP: 108/77   Pulse: (!) 118 (!) 116  Resp: 15 20  Temp: (!) 36.4 C   SpO2: 95% 95%    Last Pain:  Vitals:   12/16/17 2245  TempSrc:   PainSc: 2                  Precious Haws Maan Zarcone

## 2017-12-16 NOTE — ED Notes (Signed)
Patient states had applesauce at 9am.

## 2017-12-16 NOTE — Anesthesia Preprocedure Evaluation (Signed)
Anesthesia Evaluation  Patient identified by MRN, date of birth, ID band Patient awake    Reviewed: Allergy & Precautions, H&P , NPO status , Patient's Chart, lab work & pertinent test results  History of Anesthesia Complications Negative for: history of anesthetic complications  Airway Mallampati: III  TM Distance: <3 FB Neck ROM: full    Dental  (+) Chipped, Poor Dentition, Missing, Edentulous Upper   Pulmonary neg shortness of breath, asthma , former smoker,           Cardiovascular Exercise Tolerance: Good (-) angina(-) Past MI and (-) DOE negative cardio ROS       Neuro/Psych negative neurological ROS  negative psych ROS   GI/Hepatic Neg liver ROS, GERD  Medicated and Controlled,  Endo/Other  negative endocrine ROS  Renal/GU      Musculoskeletal   Abdominal   Peds  Hematology negative hematology ROS (+)   Anesthesia Other Findings Past Medical History: No date: Asthma No date: Diverticulitis  History reviewed. No pertinent surgical history.  BMI    Body Mass Index:  31.75 kg/m      Reproductive/Obstetrics negative OB ROS                             Anesthesia Physical Anesthesia Plan  ASA: III  Anesthesia Plan: General ETT, Rapid Sequence and Cricoid Pressure   Post-op Pain Management:    Induction: Intravenous  PONV Risk Score and Plan: Ondansetron, Dexamethasone, Midazolam and Treatment may vary due to age or medical condition  Airway Management Planned: Oral ETT  Additional Equipment:   Intra-op Plan:   Post-operative Plan: Extubation in OR and Possible Post-op intubation/ventilation  Informed Consent: I have reviewed the patients History and Physical, chart, labs and discussed the procedure including the risks, benefits and alternatives for the proposed anesthesia with the patient or authorized representative who has indicated his/her understanding and  acceptance.   Dental Advisory Given  Plan Discussed with: Anesthesiologist, CRNA and Surgeon  Anesthesia Plan Comments: (Patient consented for risks of anesthesia including but not limited to:  - adverse reactions to medications - damage to teeth, lips or other oral mucosa - sore throat or hoarseness - Damage to heart, brain, lungs or loss of life  Patient voiced understanding.)        Anesthesia Quick Evaluation

## 2017-12-16 NOTE — ED Notes (Signed)
Report to Butch Penny in surgery. Await transport.

## 2017-12-16 NOTE — ED Provider Notes (Addendum)
[facility] Emergency Department Provider Note  ____________________________________________   I have reviewed the triage vital signs and the nursing notes. Where available I have reviewed prior notes and, if possible and indicated, outside hospital notes.    HISTORY  Chief Complaint Abdominal Pain    HPI Jonathon Lee is a 62 y.o. male with a history of diverticulitis and asthma, presents today complaining of abdominal pain.  He states that he was started on antibiotics for diverticulitis 2 days ago, and seem to be feeling better until this morning he has been abdominal sharp discomfort.  Patient does have a history of diverticulitis.  Last CT scan here in June of last year shows no evidence of AAA, at that time he did have sigmoid diverticulitis.  Also known history of inguinal hernia.  It is sharp, nonradiating, had some pain on Saturday before but acutely worse today.  On antibiotics, at home.  He had sudden onset diffuse discomfort that got worse this morning.  He is very uncomfortable.  Nothing makes better nothing makes worse no other alleviating or augmenting factors, no melena no bright red blood per rectum, + of nausea    Past Medical History:  Diagnosis Date  . Asthma   . Diverticulitis     There are no active problems to display for this patient.   History reviewed. No pertinent surgical history.  Prior to Admission medications   Medication Sig Start Date End Date Taking? Authorizing Provider  albuterol (PROVENTIL HFA;VENTOLIN HFA) 108 (90 Base) MCG/ACT inhaler Inhale 2 puffs into the lungs every 4 (four) hours as needed for wheezing. 01/17/17   [provider]  amitriptyline (ELAVIL) 50 MG tablet Take 50 mg by mouth at bedtime. 01/04/17   [provider]  Fluticasone-Salmeterol (ADVAIR DISKUS) 250-50 MCG/DOSE AEPB Inhale 1 puff into the lungs every 12 (twelve) hours. 01/17/17   [provider]    Allergies Zosyn  [piperacillin sod-tazobactam so]  No family history on file.  Social History Social History   Tobacco Use  . Smoking status: Former Research scientist (life sciences)  . Smokeless tobacco: Never Used  Substance Use Topics  . Alcohol use: No  . Drug use: Never    Review of Systems Constitutional: No fever/chills Eyes: No visual changes. ENT: No sore throat. No stiff neck no neck pain Cardiovascular: Denies chest pain. Respiratory: Denies shortness of breath. Gastrointestinal:   no vomiting.  No diarrhea.  No constipation. Genitourinary: Negative for dysuria. Musculoskeletal: Negative lower extremity swelling Skin: Negative for rash. Neurological: Negative for severe headaches, focal weakness or numbness.   ____________________________________________   PHYSICAL EXAM:  VITAL SIGNS: ED Triage Vitals  Enc Vitals Group     BP 12/16/17 1033 121/67     Pulse Rate 12/16/17 1033 100     Resp 12/16/17 1033 20     Temp 12/16/17 1033 98.4 F (36.9 C)     Temp Source 12/16/17 1033 Oral     SpO2 12/16/17 1033 97 %     Weight 12/16/17 1025 213 lb (96.6 kg)     Height 12/16/17 1034 5\' 9"  (1.753 m)     Head Circumference --      Peak Flow --      Pain Score 12/16/17 1025 10     Pain Loc --      Pain Edu? --      Excl. in [place]? --     Constitutional: Alert and oriented. Well appearing and in no acute distress. Eyes: Conjunctivae are  normal Head: Atraumatic HEENT: No congestion/rhinnorhea. Mucous membranes are moist.  Oropharynx non-erythematous Neck:   Nontender with no meningismus, no masses, no stridor Cardiovascular: Normal rate, regular rhythm. Grossly normal heart sounds.  Good peripheral circulation. Respiratory: Normal respiratory effort.  No retractions. Lungs CTAB. Abdominal: very Tender in the abdomen diffusely, with guarding and possibly early rebound abdomen is not rigid but it is not quite soft either Back:  There is no focal tenderness or step off.  there is no midline tenderness there are  no lesions noted. there is no CVA tenderness Musculoskeletal: No lower extremity tenderness, no upper extremity tenderness. No joint effusions, no DVT signs strong distal pulses no edema Neurologic:  Normal speech and language. No gross focal neurologic deficits are appreciated.  Skin:  Skin is warm, dry and intact. No rash noted. Psychiatric: Mood and affect are normal. Speech and behavior are normal.  ____________________________________________   LABS (all labs ordered are listed, but only abnormal results are displayed)  Labs Reviewed  COMPREHENSIVE METABOLIC PANEL - Abnormal; Notable for the following components:      Result Value   Glucose, Bld 129 (*)    Calcium 8.5 (*)    All other components within normal limits  CBC - Abnormal; Notable for the following components:   WBC 10.9 (*)    All other components within normal limits  URINALYSIS, COMPLETE (UACMP) WITH MICROSCOPIC - Abnormal; Notable for the following components:   Color, Urine AMBER (*)    APPearance CLEAR (*)    Ketones, ur 20 (*)    Protein, ur 30 (*)    Leukocytes, UA TRACE (*)    Squamous Epithelial / LPF 0-5 (*)    All other components within normal limits  LIPASE, BLOOD    Pertinent labs  results that were available during my care of the patient were reviewed by me and considered in my medical decision making (see chart for details). ____________________________________________  EKG  I personally interpreted any EKGs ordered by me or triage  ____________________________________________  RADIOLOGY  Pertinent labs & imaging results that were available during my care of the patient were reviewed by me and considered in my medical decision making (see chart for details). If possible, patient and/or family made aware of any abnormal findings.  No results found. ____________________________________________    PROCEDURES  Procedure(s) performed: None  Procedures  Critical Care performed: CRITICAL  CARE Performed by: Schuyler Amor   Total critical care time: 45 minutes  Critical care time was exclusive of separately billable procedures and treating other patients.  Critical care was necessary to treat or prevent imminent or life-threatening deterioration.  Critical care was time spent personally by me on the following activities: development of treatment plan with patient and/or surrogate as well as nursing, discussions with consultants, evaluation of patient's response to treatment, examination of patient, obtaining history from patient or surrogate, ordering and performing treatments and interventions, ordering and review of laboratory studies, ordering and review of radiographic studies, pulse oximetry and re-evaluation of patient's condition.   ____________________________________________   INITIAL IMPRESSION / ASSESSMENT AND PLAN / ED COURSE  Pertinent labs & imaging results that were available during my care of the patient were reviewed by me and considered in my medical decision making (see chart for details).  Patient here with severe abdominal discomfort, recent CT did not show any evidence of AAA partially I did do a stat CT however, I am called by radiology and informed that he has acute  sigmoid diverticulitis with adjacent air-fluid collection concerning for developing abscess, small amount of pneumoperitoneum is noted which likely accounts for his significant discomfort.  There is also a hypodense abnormality noted in the kidney, which is somewhat larger than last time, concerning for possible renal cell carcinoma, I made patient aware of this finding and all the findings on CT.  I have called Dr. Rosana Hoes of surgery who is doing procedure to come see patient, I am giving him pain medication to try to keep him comfortable, we are giving him IV antibiotics, he is allergic to Zosyn so I am giving him Cipro and Flagyl.  ----------------------------------------- 1:14 PM on  12/16/2017 ----------------------------------------- ----------------------------------------- 1:15 PM on 12/16/2017 -----------------------------------------   Continuing to give patient pain medication  Watching the patient closely here.  Patient does have early peritoneal signs, shows why, I did make him again aware specifically the absolute need to follow-up for his renal mass, he states "I think I did know something about that and they did a CT on it".  I have not explained that it has gotten bigger since then and there is an absolute need to follow-up closely with this after the acute pathology has been addressed.  ----------------------------------------- 6:32 PM on 12/16/2017 -----------------------------------------  he was seen several hours ago by surgery, they did examine him, they did their taking to the OR he was consented for the OR, fortunately, apparently there is been a delay getting to the OR, patient has been getting pain medications here he has received IV antibiotics, we are giving him IV fluid and we are awaiting surgery.    ____________________________________________   FINAL CLINICAL IMPRESSION(S) / ED DIAGNOSES  Final diagnoses:  None      This chart was dictated using voice recognition software.  Despite best efforts to proofread,  errors can occur which can change meaning.      Schuyler Amor, MD 12/16/17 1248    Schuyler Amor, MD 12/16/17 1316    Schuyler Amor, MD 12/16/17 [phone]

## 2017-12-16 NOTE — Op Note (Addendum)
PROCEDURES: Laparotomy Hartmann's procedure Takedown of splenic flexure  Pre-operative Diagnosis:perforated diverticulitis  Post-operative Diagnosis: Same  Surgeon: Archer    Anesthesia: General endotracheal anesthesia  ASA Class: 3   Surgeon: Caroleen Hamman , MD FACS  Anesthesia: Gen. with endotracheal tube   Findings: Purulent peritonitis with significant inflammatory response around the sigmoid and small bowel. Thin adhesions from the sigmoid to the small bowel Reactive inflammatory response of the small bowel  Estimated Blood Loss: 150cc         Drains: #19 Blake drains x2         Specimens: Colon       Complications: none         Procedure Details  The patient was seen again in the Holding Room. The benefits, complications, treatment options, and expected outcomes were discussed with the patient. The risks of bleeding, infection, recurrence of symptoms, failure to resolve symptoms,  bowel injury, any of which could require further surgery were reviewed with the patient.   The patient was taken to Operating Room, identified as Jonathon Lee and the procedure verified.  A Time Out was held and the above information confirmed.  Prior to the induction of general anesthesia, antibiotic prophylaxis was administered. VTE prophylaxis was in place. General endotracheal anesthesia was then administered and tolerated well. After the induction, the abdomen was prepped with Chloraprep and draped in the sterile fashion. The patient was positioned in the supine position.  His midline laparotomy was performed with a 10 blade knife and electrocautery was used to dissect through subtenons tissue.  The peritoneum was incised and the abdomen was entered very carefully.  There was significant purulent peritonitis in the past was aspirated.  Exploration revealed evidence of perforated diverticulitis with a very thick sigmoid and severe inflammatory response around the sigmoid.  We performed  lysis of adhesions with a combination of frequent fracturing and Metzenbaum scissors.  We were able to free up the sigmoid colon from surrounding structures. We drained pericolonic abscess after finger fracturing and significant amount of pus was drained.  There was spillage of CBCs from the perforation that was controlled appropriately with a 2-0 silk suture.  She was turned to the distal margin and were able to dissect the distal sigmoid colon circumferentially after identifying the ureter and preserving it.  A contour stapler was fired on the distal end and the bowel was divided.  This point the IMA pedicle was also identified and suture ligated between clamps.  This area was significantly inflamed and the majority blood loss was from the mesentery.  We were able to divide the mesentery with the LigaSure device.  A portion of the descending colon was inspected and an area that was free of disease  Was identified, using the contour we proceeded to divide the area proximally.   Additional mobilization of splenic flexure needed to be done because his mesentery was very short and the splenic flexure takedown and allowed [place] to had adequate mobilization of the descending colon to create an end colostomy.  This was performed with electrocautery in the standard fashion. The abdominal cavity was irrigated with 3 L of saline until all the loculations are or the infection was cleared.  We placed an NG tube and confirm its position manually. 2 drains were placed within the pelvis and the right and left paracolic gutter in the standard fashion.  There was acute with 2-0 silk's An incision was created circumferentially in the LLQ in the standard fashion.  Electrocautery was used to dissect through the cutaneous tissue and the fascia was incised in a cruciate fashion.  We were able to place 2 fingers and brought the and of the descending colon    We changed gloves and close the abdomen with a 0 PDS suture in a  running fashion the skin was left open and a wet to dry was placed.. Liposomal Marcaine was injected on all incision sites under direct visualization.  Colostomy mature w interrupted 3-0 vicryls.  Needle and laparotomy count were correct and there were no immediate complications  Caroleen Hamman, MD, FACS

## 2017-12-16 NOTE — Progress Notes (Signed)
Preoperative Review   Patient is met in the preoperative holding area. The history is reviewed in the chart and with the patient. I personally reviewed the options and rationale as well as the risks of this procedure that have been previously discussed with the patient. All questions asked by the patient and/or family were answered to their satisfaction. Pt with peritonitis in need for Hartmann's/ D/W pt in detail about the procedure, risks, benefits and possible complications (including but not limited to injury to ureter, bladder , bowel. Bleeding and infection)  Patient agrees to proceed with this procedure at this time.  Caroleen Hamman M.D. FACS

## 2017-12-16 NOTE — Anesthesia Post-op Follow-up Note (Signed)
Anesthesia QCDR form completed.        

## 2017-12-16 NOTE — Anesthesia Procedure Notes (Signed)
Procedure Name: Intubation Date/Time: 12/16/2017 7:36 PM Performed by: Lendon Colonel, CRNA Pre-anesthesia Checklist: Patient identified, Patient being monitored, Timeout performed, Emergency Drugs available and Suction available Patient Re-evaluated:Patient Re-evaluated prior to induction Oxygen Delivery Method: Circle system utilized Preoxygenation: Pre-oxygenation with 100% oxygen Induction Type: IV induction, Rapid sequence and Cricoid Pressure applied Laryngoscope Size: Miller and 2 Grade View: Grade II Tube type: Oral Tube size: 7.5 mm Number of attempts: 1 Airway Equipment and Method: Stylet Placement Confirmation: ETT inserted through vocal cords under direct vision,  positive ETCO2 and breath sounds checked- equal and bilateral Secured at: 21 cm Tube secured with: Tape Dental Injury: Teeth and Oropharynx as per pre-operative assessment

## 2017-12-16 NOTE — H&P (Signed)
Subjective: Patient is a 62 y.o. male presents with left lower quadrant pain and fever.  Onset of symptoms was abrupt starting 4 days ago with rapidly worsening course since that time. Pain is with radiation to generalized abdomen. Patient describes the pain as cramping, continuous and rated as diffuse. Pain has been associated with abdominal pain. Patient denies melena. Symptoms are aggravated by activity. Symptoms improve with none. Past history includes diverticulitis, previous results: no CT in the past, treated by PCP. Previous studies include colonoscopy.  There are no active problems to display for this patient.  Past Medical History:  Diagnosis Date  . Asthma   . Diverticulitis     History reviewed. No pertinent surgical history.   (Not in a hospital admission) Allergies  Allergen Reactions  . Zosyn [Piperacillin Sod-Tazobactam So] Hives    Social History   Tobacco Use  . Smoking status: Former Research scientist (life sciences)  . Smokeless tobacco: Never Used  Substance Use Topics  . Alcohol use: No    No family history on file.  Review of Systems Pertinent items noted in HPI and remainder of comprehensive ROS otherwise negative.  Objective: Vital signs in last 24 hours: Temp:  [98.4 F (36.9 C)] 98.4 F (36.9 C) (04/22 1033) Pulse Rate:  [98-111] 111 (04/22 1254) Resp:  [20-28] 28 (04/22 1254) BP: (117-128)/(64-75) 128/64 (04/22 1254) SpO2:  [94 %-97 %] 97 % (04/22 1254) Weight:  [213 lb (96.6 kg)-215 lb (97.5 kg)] 215 lb (97.5 kg) (04/22 1034)  BP 128/64   Pulse (!) 111   Temp 98.4 F (36.9 C) (Oral)   Resp (!) 28   Ht 5\' 9"  (1.753 m)   Wt 215 lb (97.5 kg)   SpO2 97%   BMI 31.75 kg/m   General Appearance:    Alert, cooperative, no distress, appears stated age  Head:    Normocephalic, without obvious abnormality, atraumatic  Eyes:    PERRL, conjunctiva/corneas clear, EOM's intact, fundi    benign, both eyes       Ears:    Normal TM's and external ear canals, both ears  Nose:    Nares normal, septum midline, mucosa normal, no drainage    or sinus tenderness  Throat:   Lips, mucosa, and tongue normal; teeth and gums normal  Neck:   Supple, symmetrical, trachea midline, no adenopathy;       thyroid:  No enlargement/tenderness/nodules; no carotid   bruit or JVD  Back:     Symmetric, no curvature, ROM normal, no CVA tenderness  Lungs:     Clear to auscultation bilaterally, respirations unlabored  Chest wall:    No tenderness or deformity  Heart:    Regular rate and rhythm, S1 and S2 normal, no murmur, rub   or gallop  Abdomen:    Firm , TTP greatest in suprapubic region but overal generalized pain with peritonitis  Genitalia:    Normal male without lesion, discharge or tenderness  Rectal:    Normal tone, normal prostate, no masses or tenderness;   guaiac negative stool  Extremities:   Extremities normal, atraumatic, no cyanosis or edema  Pulses:   2+ and symmetric all extremities  Skin:   Skin color, texture, turgor normal, no rashes or lesions  Lymph nodes:   Cervical, supraclavicular, and axillary nodes normal  Neurologic:   CNII-XII intact. Normal strength, sensation and reflexes      throughout    Data Review CBC:  Lab Results  Component Value Date   WBC 10.9 (  H) 12/16/2017   RBC 5.07 12/16/2017   BMP:  Lab Results  Component Value Date   GLUCOSE 129 (H) 12/16/2017   CO2 24 12/16/2017   BUN 19 12/16/2017   CREATININE 1.22 12/16/2017   CALCIUM 8.5 (L) 12/16/2017   Coagulation: No results found for: INR, APTT Cardiac markers: No results found for: CKMB, TROPONINT, MYOGLOBIN ABGs: No results found for: [facility] Radiology review: Ct with pneumoperitoneum and suprapubic abscess and inflammation consistent with complicated diverticulitis  Assessment/Plan: Plan for urgent colectomy and possible ostomy formation.  R/B/A discussed with the patient and wife.  These include conservative treatment with IV antibiotics versus surgical intervention.  Risks of each of  those options was discussed.  The chose to proceed with surgery  Indications for hospitalization: peritoneal signs  Admit to non-tele bed. NPO. IV Fluids with normal saline. IV Antibiotics per orders. OR today for partial colectomy, possible ostomy formation

## 2017-12-17 ENCOUNTER — Encounter: Payer: Self-pay | Admitting: General Surgery

## 2017-12-17 LAB — CBC
HEMATOCRIT: 44.9 % (ref 40.0–52.0)
HEMOGLOBIN: 15.3 g/dL (ref 13.0–18.0)
MCH: 31.4 pg (ref 26.0–34.0)
MCHC: 34.1 g/dL (ref 32.0–36.0)
MCV: 92.1 fL (ref 80.0–100.0)
Platelets: 232 10*3/uL (ref 150–440)
RBC: 4.88 MIL/uL (ref 4.40–5.90)
RDW: 14 % (ref 11.5–14.5)
WBC: 8.3 10*3/uL (ref 3.8–10.6)

## 2017-12-17 LAB — COMPREHENSIVE METABOLIC PANEL
ALT: 15 U/L — AB (ref 17–63)
ANION GAP: 6 (ref 5–15)
AST: 28 U/L (ref 15–41)
Albumin: 2.4 g/dL — ABNORMAL LOW (ref 3.5–5.0)
Alkaline Phosphatase: 28 U/L — ABNORMAL LOW (ref 38–126)
BUN: 18 mg/dL (ref 6–20)
CHLORIDE: 108 mmol/L (ref 101–111)
CO2: 20 mmol/L — AB (ref 22–32)
CREATININE: 1.2 mg/dL (ref 0.61–1.24)
Calcium: 7.2 mg/dL — ABNORMAL LOW (ref 8.9–10.3)
GFR calc non Af Amer: >60 mL/min (ref >60)
Glucose, Bld: 270 mg/dL — ABNORMAL HIGH (ref 65–99)
POTASSIUM: 4.4 mmol/L (ref 3.5–5.1)
SODIUM: 134 mmol/L — AB (ref 135–145)
Total Bilirubin: 0.7 mg/dL (ref 0.3–1.2)
Total Protein: 5.2 g/dL — ABNORMAL LOW (ref 6.5–8.1)

## 2017-12-17 LAB — GLUCOSE, CAPILLARY: Glucose-Capillary: 129 mg/dL — ABNORMAL HIGH (ref 65–99)

## 2017-12-17 LAB — PHOSPHORUS: Phosphorus: 3.2 mg/dL (ref 2.5–4.6)

## 2017-12-17 LAB — MAGNESIUM: Magnesium: 1.8 mg/dL (ref 1.7–2.4)

## 2017-12-17 MED ORDER — PHENOL 1.4 % MT LIQD
1.0000 | OROMUCOSAL | Status: DC | PRN
  Administered 2017-12-17: 1 via OROMUCOSAL
  Filled 2017-12-17: qty 177

## 2017-12-17 MED ORDER — LACTATED RINGERS IV BOLUS
1000.0000 mL | Freq: Once | INTRAVENOUS | Status: AC
  Administered 2017-12-17: 1000 mL via INTRAVENOUS

## 2017-12-17 MED ORDER — ALBUMIN HUMAN 25 % IV SOLN
25.0000 g | Freq: Once | INTRAVENOUS | Status: AC
  Administered 2017-12-17: 25 g via INTRAVENOUS
  Filled 2017-12-17: qty 100

## 2017-12-17 MED ORDER — SODIUM CHLORIDE 0.9 % IV BOLUS
500.0000 mL | Freq: Once | INTRAVENOUS | Status: AC
  Administered 2017-12-17: 500 mL via INTRAVENOUS

## 2017-12-17 NOTE — Progress Notes (Signed)
1 Day Post-Op   Subjective/Chief Complaint: Pain much improved since surgery.  Wishes to get out of bed.     Objective: Vital signs in last 24 hours: Temp:  [97.1 F (36.2 C)-98.4 F (36.9 C)] 98.3 F (36.8 C) (04/23 0455) Pulse Rate:  [95-130] 95 (04/23 0455) Resp:  [12-38] 24 (04/23 0455) BP: (92-129)/(64-80) 98/71 (04/23 0455) SpO2:  [88 %-97 %] 97 % (04/23 0455) Weight:  [213 lb (96.6 kg)-215 lb (97.5 kg)] 215 lb (97.5 kg) (04/22 1034)    Intake/Output from previous day: 04/22 0701 - 04/23 0700 In: 3583 [P.O.:30; I.V.:3553] Out: 2210 [Urine:1080; Emesis/NG output:150; Drains:230; Blood:150] Intake/Output this shift: Total I/O In: 800 [I.V.:300; IV Piggyback:500] Out: 0   GI: Abdomen soft, appropriately TTP.  incision c/d/i. ostomy is dark red but viable and edematous.  Drains are serosanguinous  Lab Results:  Recent Labs    12/16/17 1034 12/17/17 0509  WBC 10.9* 8.3  HGB 16.0 15.3  HCT 46.4 44.9  PLT 231 232   BMET Recent Labs    12/16/17 1034 12/17/17 0509  NA 136 134*  K 4.1 4.4  CL 103 108  CO2 24 20*  GLUCOSE 129* 270*  BUN 19 18  CREATININE 1.22 1.20  CALCIUM 8.5* 7.2*   PT/INR No results for input(s): LABPROT, INR in the last 72 hours. ABG No results for input(s): PHART, HCO3 in the last 72 hours.  Invalid input(s): PCO2, PO2  Studies/Results: Ct Abdomen Pelvis W Contrast  Result Date: 12/16/2017 CLINICAL DATA:  Acute generalized abdominal pain. EXAM: CT ABDOMEN AND PELVIS WITH CONTRAST TECHNIQUE: Multidetector CT imaging of the abdomen and pelvis was performed using the standard protocol following bolus administration of intravenous contrast. CONTRAST:  121mL OMNIPAQUE IOHEXOL 300 MG/ML  SOLN COMPARISON:  CT scan of February 12, 2017. FINDINGS: Lower chest: No acute abnormality. Hepatobiliary: No focal liver abnormality is seen. No gallstones, gallbladder wall thickening, or biliary dilatation. Pancreas: Unremarkable. No pancreatic ductal  dilatation or surrounding inflammatory changes. Spleen: Normal in size without focal abnormality. Adrenals/Urinary Tract: Adrenal glands appear normal. Stable bilateral simple renal cysts. 2.3 x 2.3 cm hyperdense abnormality is again noted medially in midpole of right kidney which appears to be slightly enlarged compared to prior exam. It demonstrates enhancement of 52 Hounsfield units on immediate postcontrast images and 59 on delayed post-contrast images. Stomach/Bowel: The stomach appears normal. Acute sigmoid diverticulitis is noted. 4.7 x 1.8 cm air-fluid collection is seen adjacent to inflamed segment of colon concerning for developing abscess. Small amount of pneumoperitoneum is noted in epigastric region concerning for perforation. Vascular/Lymphatic: Aortic atherosclerosis. No enlarged abdominal or pelvic lymph nodes. Reproductive: Prostate is unremarkable. Other: Mild bilateral fat containing inguinal hernias are noted. Musculoskeletal: No acute or significant osseous findings. IMPRESSION: Acute sigmoid diverticulitis is noted with adjacent air-fluid collection concerning for developing abscess. Small amount of pneumoperitoneum is noted in epigastric region concerning for perforation. 2.3 cm hyperdense abnormality is noted medially in midpole of right kidney which is increased in size compared to prior exam. This is concerning for renal cell carcinoma, and further evaluation with MRI with and without gadolinium administration is recommended. Critical Value/emergent results were called by telephone at the time of interpretation on 12/16/2017 at 12:41 pm to Dr. Charlotte Crumb , who verbally acknowledged these results. Electronically Signed   By: Marijo Conception, M.D.   On: 12/16/2017 12:41    Anti-infectives: Anti-infectives (From admission, onward)   Start     Dose/Rate Route Frequency Ordered  Stop   12/17/17 0400  metroNIDAZOLE (FLAGYL) IVPB 500 mg     500 mg 100 mL/hr over 60 Minutes Intravenous  Every 8 hours 12/16/17 2148     12/17/17 0100  ciprofloxacin (CIPRO) IVPB 400 mg     400 mg 200 mL/hr over 60 Minutes Intravenous Every 12 hours 12/16/17 2148     12/16/17 2000  metroNIDAZOLE (FLAGYL) IVPB 500 mg  Status:  Discontinued     500 mg 100 mL/hr over 60 Minutes Intravenous  Once 12/16/17 1951 12/16/17 2253   12/16/17 1245  ciprofloxacin (CIPRO) IVPB 400 mg     400 mg 200 mL/hr over 60 Minutes Intravenous  Once 12/16/17 1230 12/16/17 1416   12/16/17 1245  metroNIDAZOLE (FLAGYL) IVPB 500 mg     500 mg 100 mL/hr over 60 Minutes Intravenous  Once 12/16/17 1230 12/16/17 1517      Assessment/Plan: s/p Procedure(s): EXPLORATORY LAPAROTOMY/ SIGMOID COLECTOMY, colostomy (N/A) Continue foley due to urinary output monitoring Continue antibiotics for 1 week after source control.  Continue NGT awaiting return of bowel function and anticipated ileus OOB to chair today Loevenox prophylaxis  LOS: 1 day    Andres Labrum 12/17/2017

## 2017-12-17 NOTE — Consult Note (Addendum)
Explained role of ostomy nurse and creation of stoma. Explained stoma characteristics (budded, flush, color, texture, care).  Currently the stoma is 1 1/4 inches in size and is darkened in color with color approaching black and nearly flush with peristomal tissue.  Sutures are intact.  Serosanginous drainage is in the existing pouch.  Explained the meaning of a darkened stoma and that some of the stoma will slough off. Demonstrated pouch change (cutting new skin barrier, measuring stoma, cleaning peristomal skin and stoma, use of barrier ring). New  2 pc pouching system (flat barrier 2 1/4 inch) and pouch placed using a barrier ring. Education on emptying when 1/3 to 1/2 full and how to empty. Discussed risk of peristomal hernia. Answered patient/family questions to the patient and his spouse's expressed satisfaction. Thank you for the consult.  Discussed plan of care with the patient and bedside nurse.  Val Riles, RN, MSN, CWOCN, CNS-BC, pager [phone]

## 2017-12-17 NOTE — Progress Notes (Addendum)
Notified surgeon of dark color of ostomy. MD aware already will continue to monitor.

## 2017-12-17 NOTE — Progress Notes (Signed)
Per Dr. Rosana Hoes okay to place order for chloraseptic spray for patient to have at bedside.

## 2017-12-17 NOTE — Progress Notes (Signed)
Per Dr. Rosana Hoes okay to place order for wound care consult.

## 2017-12-17 NOTE — Progress Notes (Signed)
Spoke with MD in regard to low output compared to input. Per MD no new orders received will continue to monitor.

## 2017-12-17 NOTE — Progress Notes (Signed)
Inpatient Diabetes Program Recommendations  AACE/ADA: New Consensus Statement on Inpatient Glycemic Control (2015)  Target Ranges:  Prepandial:   less than 140 mg/dL      Peak postprandial:   less than 180 mg/dL (1-2 hours)      Critically ill patients:  140 - 180 mg/dL   Results for Jonathon Lee, Jonathon Lee (MRN [id number]) as of 12/17/2017 09:28  Ref. Range 12/16/2017 10:34 12/17/2017 05:09  Glucose Latest Ref Range: 65 - 99 mg/dL 129 (H) 270 (H)   Review of Glycemic Control  Diabetes history: No Outpatient Diabetes medications: NA Current orders for Inpatient glycemic control: None  Inpatient Diabetes Program Recommendations:  Correction (SSI): Noted lab glucose of 270 mg/dl at 5:09 this morning. May want to consider ordering CBGs Q4H with Novolog 0-9 units Q4H if needed. HgbA1C: Please consider ordering an A1C to evaluate glycemic control over the past 2-3 months.  Thanks, Barnie Alderman, RN, MSN, CDE Diabetes Coordinator Inpatient Diabetes Program [phone] (Team Pager from 8am to 5pm)

## 2017-12-17 NOTE — Progress Notes (Signed)
Seen and examined. Doing well low U/O  Will add bolus Abd soft, ostomy dark . Thick mesentery and short stump due to operative finding and difficulty of the case  A/p Continue current care IVF A/Bs NGT

## 2017-12-18 ENCOUNTER — Inpatient Hospital Stay: Payer: BC Managed Care – PPO

## 2017-12-18 LAB — BASIC METABOLIC PANEL
Anion gap: 5 (ref 5–15)
BUN: 13 mg/dL (ref 6–20)
CHLORIDE: 110 mmol/L (ref 101–111)
CO2: 22 mmol/L (ref 22–32)
CREATININE: 0.85 mg/dL (ref 0.61–1.24)
Calcium: 7.3 mg/dL — ABNORMAL LOW (ref 8.9–10.3)
GFR calc Af Amer: >60 mL/min (ref >60)
GLUCOSE: 148 mg/dL — AB (ref 65–99)
Potassium: 3.9 mmol/L (ref 3.5–5.1)
Sodium: 137 mmol/L (ref 135–145)

## 2017-12-18 LAB — SURGICAL PATHOLOGY

## 2017-12-18 LAB — CBC
HCT: 36.6 % — ABNORMAL LOW (ref 40.0–52.0)
Hemoglobin: 12.4 g/dL — ABNORMAL LOW (ref 13.0–18.0)
MCH: 31.4 pg (ref 26.0–34.0)
MCHC: 33.8 g/dL (ref 32.0–36.0)
MCV: 92.7 fL (ref 80.0–100.0)
PLATELETS: 169 10*3/uL (ref 150–440)
RBC: 3.95 MIL/uL — ABNORMAL LOW (ref 4.40–5.90)
RDW: 14.3 % (ref 11.5–14.5)
WBC: 7.7 10*3/uL (ref 3.8–10.6)

## 2017-12-18 LAB — HIV ANTIBODY (ROUTINE TESTING W REFLEX): HIV SCREEN 4TH GENERATION: NONREACTIVE

## 2017-12-18 LAB — MAGNESIUM: Magnesium: 1.9 mg/dL (ref 1.7–2.4)

## 2017-12-18 MED ORDER — LACTATED RINGERS IV SOLN
INTRAVENOUS | Status: DC
  Administered 2017-12-18 – 2017-12-21 (×7): via INTRAVENOUS

## 2017-12-18 MED ORDER — FUROSEMIDE 10 MG/ML IJ SOLN
40.0000 mg | Freq: Once | INTRAMUSCULAR | Status: AC
  Administered 2017-12-18: 40 mg via INTRAVENOUS
  Filled 2017-12-18: qty 4

## 2017-12-18 NOTE — Progress Notes (Signed)
2 Days Post-Op   Subjective/Chief Complaint: Doing well overall.  Patient had some SOB this am but VSS and PCXR obtained.  Appears to be concerning for atelectasis vs PNA.  Pain is well controlled.  Denies nausea   Objective: Vital signs in last 24 hours: Temp:  [97.6 F (36.4 C)-98.5 F (36.9 C)] 97.6 F (36.4 C) (04/24 0757) Pulse Rate:  [89-101] 101 (04/24 0757) Resp:  [18-24] 24 (04/24 0757) BP: (109-134)/(64-80) 129/80 (04/24 0757) SpO2:  [94 %-99 %] 96 % (04/24 0757)    Intake/Output from previous day: 04/23 0701 - 04/24 0700 In: 2236 [I.V.:1200; IV Piggyback:1036] Out: 1600 [Urine:950; Emesis/NG output:350; Drains:300] Intake/Output this shift: Total I/O In: 3565 [I.V.:3565] Out: 325 [Urine:275; Drains:50]  General appearance: alert, cooperative and no distress GI: Soft, appropriately TTP, incision c/d/i, drains are serosanguinous.  Ostomy is dark red and edematous, no output  Lab Results:  Recent Labs    12/17/17 0509 12/18/17 0524  WBC 8.3 7.7  HGB 15.3 12.4*  HCT 44.9 36.6*  PLT 232 169   BMET Recent Labs    12/17/17 0509 12/18/17 0524  NA 134* 137  K 4.4 3.9  CL 108 110  CO2 20* 22  GLUCOSE 270* 148*  BUN 18 13  CREATININE 1.20 0.85  CALCIUM 7.2* 7.3*   PT/INR No results for input(s): LABPROT, INR in the last 72 hours. ABG No results for input(s): PHART, HCO3 in the last 72 hours.  Invalid input(s): PCO2, PO2  Studies/Results: Dg Chest Port 1 View  Result Date: 12/18/2017 CLINICAL DATA:  Increase shortness of breath since 2 a.m. today. EXAM: PORTABLE CHEST 1 VIEW COMPARISON:  Chest x-ray of November 14 two thousand six and CT scan of the chest of September 01, 2015 FINDINGS: The lungs are mildly hypoinflated. The interstitial markings are increased. There is patchy increased density at the left base with obscuration of the hemidiaphragm. There is a small left pleural effusion and likely small right pleural effusion. The cardiac silhouette is  enlarged. The pulmonary vascularity is mildly prominent centrally. The esophagogastric tube tip and proximal port project below the GE junction. IMPRESSION: Findings worrisome for atelectasis or pneumonia in the left lower lobe. Probable small bilateral pleural effusions. I cannot exclude low-grade CHF in the appropriate clinical setting. Followup PA and lateral chest X-ray is recommended in 3-4 weeks following trial of antibiotic therapy to ensure resolution and exclude underlying malignancy. Electronically Signed   By: David  Martinique M.D.   On: 12/18/2017 08:46    Anti-infectives: Anti-infectives (From admission, onward)   Start     Dose/Rate Route Frequency Ordered Stop   12/17/17 0400  metroNIDAZOLE (FLAGYL) IVPB 500 mg     500 mg 100 mL/hr over 60 Minutes Intravenous Every 8 hours 12/16/17 2148     12/17/17 0100  ciprofloxacin (CIPRO) IVPB 400 mg     400 mg 200 mL/hr over 60 Minutes Intravenous Every 12 hours 12/16/17 2148     12/16/17 2000  metroNIDAZOLE (FLAGYL) IVPB 500 mg  Status:  Discontinued     500 mg 100 mL/hr over 60 Minutes Intravenous  Once 12/16/17 1951 12/16/17 2253   12/16/17 1245  ciprofloxacin (CIPRO) IVPB 400 mg     400 mg 200 mL/hr over 60 Minutes Intravenous  Once 12/16/17 1230 12/16/17 1416   12/16/17 1245  metroNIDAZOLE (FLAGYL) IVPB 500 mg     500 mg 100 mL/hr over 60 Minutes Intravenous  Once 12/16/17 1230 12/16/17 1517  Assessment/Plan: s/p Procedure(s): EXPLORATORY LAPAROTOMY/ SIGMOID COLECTOMY, colostomy (N/A) Await bowel function before clamping NGT.   Aggressive pulmonary hygiene as this is likely Atelectasis vs PNA. Continue dressing changes.   Continue drains and ABX  DC foley in am.    LOS: 2 days    Andres Labrum 12/18/2017

## 2017-12-18 NOTE — Progress Notes (Signed)
Notified Dr. Rosana Hoes that patient is short of breath vss. Pt does have some swelling in extremeties. Per MD okay to place order for POrtable chest x-ray encourage incentive.

## 2017-12-18 NOTE — Progress Notes (Signed)
Seen and examined AVSS Doing well Persistent ileus Drain serous A bit overloaded Ostomy better no output yet  Plan Lasix 40 x 1 Decrease MIVF  Mobilize Continue a/bs ngt

## 2017-12-18 NOTE — Consult Note (Signed)
[place] Nurse ostomy follow up Stoma type/location: LLQ, end colostomy Per patient today, temporary Patient still in NG and no output Stomal assessment/size: per previous WOC note 1 1/4" flush with skin and dark. Today continues to appear dark Peristomal assessment: NA Treatment options for stomal/peristomal skin: Using 2" barrier ring under skin barrier for flush stoma  Output bloody, minimal  Ostomy pouching: 2pc. 2 1/4" in place with barrier ring Education provided:  Education on emptying when 1/3 to 1/2 full and how to empty Patient demonstrated back to [facility] nurse independence with opening and closing Lock and Roll closure Demonstrated "burping" flatus from pouch Demonstrated use of wick to clean spout  Discussed bathing, diet, gas, medication use, constipation Provided patient with information on [employer] for ostomy accessories    Per patient wife was in the room for pouch change today. She is not today and will not be available until after 6pm each day.  I have explained our team is not on the campus at that time of day but I am going to send DVD with samples that will be helpful for he and his wife at home.   Suggest HHRN for continued support at home for ostomy and wound care Enrolled patient in [employer] Start Discharge program: Yes Extra ostomy supplies left in patient room for use  [place] Nurse will follow along with you for continued support with ostomy teaching and care [place] MSN, [place], [place], [place], [place]

## 2017-12-19 LAB — BASIC METABOLIC PANEL
Anion gap: 6 (ref 5–15)
BUN: 17 mg/dL (ref 6–20)
CALCIUM: 7.5 mg/dL — AB (ref 8.9–10.3)
CHLORIDE: 106 mmol/L (ref 101–111)
CO2: 24 mmol/L (ref 22–32)
CREATININE: 1 mg/dL (ref 0.61–1.24)
GFR calc non Af Amer: >60 mL/min (ref >60)
GLUCOSE: 145 mg/dL — AB (ref 65–99)
Potassium: 3.3 mmol/L — ABNORMAL LOW (ref 3.5–5.1)
Sodium: 136 mmol/L (ref 135–145)

## 2017-12-19 LAB — CBC
HCT: 33.6 % — ABNORMAL LOW (ref 40.0–52.0)
HEMOGLOBIN: 11.7 g/dL — AB (ref 13.0–18.0)
MCH: 31.9 pg (ref 26.0–34.0)
MCHC: 34.8 g/dL (ref 32.0–36.0)
MCV: 91.5 fL (ref 80.0–100.0)
PLATELETS: 191 10*3/uL (ref 150–440)
RBC: 3.68 MIL/uL — AB (ref 4.40–5.90)
RDW: 13.7 % (ref 11.5–14.5)
WBC: 8.3 10*3/uL (ref 3.8–10.6)

## 2017-12-19 LAB — MAGNESIUM: Magnesium: 1.8 mg/dL (ref 1.7–2.4)

## 2017-12-19 MED ORDER — POTASSIUM CHLORIDE 10 MEQ/100ML IV SOLN
10.0000 meq | INTRAVENOUS | Status: AC
  Administered 2017-12-19 (×3): 10 meq via INTRAVENOUS
  Filled 2017-12-19 (×3): qty 100

## 2017-12-19 MED ORDER — FUROSEMIDE 10 MG/ML IJ SOLN
20.0000 mg | Freq: Once | INTRAMUSCULAR | Status: AC
  Administered 2017-12-19: 20 mg via INTRAVENOUS
  Filled 2017-12-19: qty 4

## 2017-12-19 NOTE — Progress Notes (Signed)
Seen and examined Failed clamping of NGT Good Uo, responded to dose of lasix No ostomy output avss Creat ok  PE NAd Abd: soft, drain serous, dressing intact. No peritonitis. Dark stoma, no necrosis. Some sweat.  A/p Doing well Ileus keep ngt If ileus does not improve 24-48hrs may consider TPN A/Bs Labs in am

## 2017-12-19 NOTE — Progress Notes (Signed)
Verbal order from on-call surgeon to clamp NGT and start pt on clear liquid diet. Pt verbalizes understanding to let RN know if he starts to feel any nausea. Will continue to monitor pt.  Nicki Furlan [employer]

## 2017-12-19 NOTE — Progress Notes (Signed)
RN called into pt.'s room, pt stated "I feel like I am about to pop." RN hooked pt back to low intermit suction and received 260 mL immediately out. On call surgeon was notified. New orders to leave pt on low intermit suction and make NPO. Will continue to monitor pt.   Jonathon Lee

## 2017-12-19 NOTE — Consult Note (Signed)
[facility] Nurse ostomy follow up Stoma type/location: LLQ end colostomy Stomal assessment/size: Dark stoma, moist Peristomal assessment: pouch intact Treatment options for stomal/peristomal skin: barrier ring Output blood tinged liquid only in pouch Ostomy pouching: 2pc. 2 1/4" with barrier ring Education provided: Spouse not at bedside.  NO ostomy function at this time.  Stoma sweat only.  Patient unsure when wife could observe pouch change.  Will perform 12/20/17.  Encouraged wife to attend via patient.  He has reviewed written materials and says " we will be fine".  We discussed emptying when 1/3 full.  Showering and activity with an ostomy.  He is encouraged and anxious to feel better.  Enrolled patient in [facility] Discharge program: Yes Woc team will follow.  Domenic Moras RN BSN [place] Pager [phone]

## 2017-12-20 LAB — BASIC METABOLIC PANEL
ANION GAP: 6 (ref 5–15)
BUN: 22 mg/dL — ABNORMAL HIGH (ref 6–20)
CALCIUM: 7.7 mg/dL — AB (ref 8.9–10.3)
CO2: 28 mmol/L (ref 22–32)
Chloride: 105 mmol/L (ref 101–111)
Creatinine, Ser: 1.03 mg/dL (ref 0.61–1.24)
GFR calc Af Amer: >60 mL/min (ref >60)
Glucose, Bld: 107 mg/dL — ABNORMAL HIGH (ref 65–99)
POTASSIUM: 3.5 mmol/L (ref 3.5–5.1)
SODIUM: 139 mmol/L (ref 135–145)

## 2017-12-20 LAB — HEMOGLOBIN A1C
HEMOGLOBIN A1C: 5.7 % — AB (ref 4.8–5.6)
Mean Plasma Glucose: 116.89 mg/dL

## 2017-12-20 MED ORDER — MORPHINE SULFATE (PF) 2 MG/ML IV SOLN
2.0000 mg | INTRAVENOUS | Status: DC | PRN
  Administered 2017-12-20 – 2017-12-28 (×15): 2 mg via INTRAVENOUS
  Filled 2017-12-20 (×17): qty 1

## 2017-12-20 NOTE — Consult Note (Signed)
[facility] Nurse ostomy follow up Stoma type/location: LLQ colostomy Stomal assessment/size: 1 and 3/8 inches, slightly raised, dark with gradually sloughing dark mucosa and scant amount of blood. Os appears at center. Peristomal assessment: Intact  Treatment options for stomal/peristomal skin: Skin barrier ring for mild convexity.  Output: None, only sweat and scant serosanguinous drainage in pouch.  Patient states that he thinks he passed some flatus  this am, but is wearing a pouch with a gas filter, so I am not able to verify this report. NGT and indwelling urinary catheter are still in place (patient failed NGT clamp trial yesterday).   I have placed a pouch with no gas filter today.  Ostomy pouching: 2pc 2 and 1/4 inch flat pouching system with skin barrier ring.  Skin barrier is cut off center today to allow for barrier placement without trimming at medial edge (near midline wound).   Education provided: No family in room as wife visits in pm.  Patient is not able to visualize his stoma.  He is confident that he and his wife an "manage" the ostomy, but I fear he is underestimating the impact of a fecal diversion-particularly since he has not yet had function and the teaching sessions have only simulated skills.  He is able to talk through the steps of pouch change, perform Lock and Roll closure and states that both he and his wife have read the ostomy educational booklet.   Note: Patient must empty pouch, clean tail closure of pouch and perform Lock and Roll closure independently prior to discharge as this is a skill that will be performed several times daily (3-5). An HHRN will not be present for those times when the pouch must be emptied and if his wife is working, she will not be there for support.  Recommend HHRN for ostomy support/continued teaching post discharge and continued assessment of wound. If you agree, please order/arrange.    Supplies at bedside today (on widow sill in room in plastic  bag): 6 skin barriers and 6 skin barrier rings.  4 pouches with no gas filter and 2 pouches with integrated gas filter, 1 bottle of M9 room deodorizer, 1 pair scissors. Discussed twice weekly and PRN ostomy pouch change schedule and we together note that he has approximately 3 weeks worth of supplies.  Enrolled patient in [place] Discharge program: Yes, previously by my associate, M. Austin on 4/24.  [facility] nursing team will continue to follow, and will remain available to this patient, the nursing, surgical and medical teams.   Thanks, Maudie Flakes, MSN, RN, [place], Arther Abbott  Pager# [phone]

## 2017-12-20 NOTE — Progress Notes (Signed)
Foley removed per order. pt is due to void by 1900 12/20/2017.   Shiesha Jahn [employer]

## 2017-12-20 NOTE — Progress Notes (Addendum)
ADDENDUM: Patient reports a few additional episodes of flatus from patient's colostomy, but NG tube drained an additional 300 mL of similarly thick, dark green fluid over the past < 8 hours. Discussed with patient and his wife, will keep NG tube for now and reassess in am.  -- Corene Cornea E. Rosana Hoes, MD, [place]: [facility] - Partnering for exceptional care. Office: [facility] Hospital Day(s): 4.   Post op day(s): 4 Days Post-Op.   Interval History: Patient seen and examined, no acute events or new complaints overnight. Patient reports minimal peri-incisional abdominal pain, ambulation in the halls without difficulty, with a 5 - 6 episodes of flatus from his ostomy, and he denies N/V, fever/chills, CP, or SOB. However, ~600 mL of thick dark green fluid has drained overnight from his NG tube.  Review of Systems:  Constitutional: denies fever, chills  Respiratory: denies any shortness of breath  Cardiovascular: denies chest pain or palpitations  Gastrointestinal: abdominal pain, N/V, and bowel function as per interval history Musculoskeletal: denies pain, decreased motor or sensation Integumentary: denies any other rashes or skin discolorations except post-surgical abdominal wounds  Vital signs in last 24 hours: [min-max] current  Temp:  [98.6 F (37 C)-99.7 F (37.6 C)] 98.7 F (37.1 C) (04/26 0433) Pulse Rate:  [87-102] 87 (04/26 0433) Resp:  [16-20] 18 (04/26 0433) BP: (120-142)/(75-76) 120/75 (04/26 0433) SpO2:  [94 %] 94 % (04/26 0433)     Height: 5\' 9"  (175.3 cm) Weight: 215 lb (97.5 kg) BMI (Calculated): 31.74   Intake/Output this shift:  No intake/output data recorded.   Intake/Output last 2 shifts:  @IOLAST2SHIFTS @   Physical Exam:  Constitutional: alert, cooperative and no distress  Respiratory: breathing non-labored at rest  Cardiovascular: regular rate and sinus rhythm  Gastrointestinal: soft,  non-tender, and non-distended with incisions well-approximated without surrounding erythema or drainage, drain well-secured with small-volume serosanguinous fluid in the bulb and tubing, dusky appearing Left-sided end-colostomy without any significant gas or liquid in patient's ostomy bag  Labs:  CBC Latest Ref Rng & Units 12/19/2017 12/18/2017 12/17/2017  WBC 3.8 - 10.6 K/uL 8.3 7.7 8.3  Hemoglobin 13.0 - 18.0 g/dL 11.7(L) 12.4(L) 15.3  Hematocrit 40.0 - 52.0 % 33.6(L) 36.6(L) 44.9  Platelets 150 - 440 K/uL 191 169 232   CMP Latest Ref Rng & Units 12/20/2017 12/19/2017 12/18/2017  Glucose 65 - 99 mg/dL 107(H) 145(H) 148(H)  BUN 6 - 20 mg/dL 22(H) 17 13  Creatinine 0.61 - 1.24 mg/dL 1.03 1.00 0.85  Sodium 135 - 145 mmol/L 139 136 137  Potassium 3.5 - 5.1 mmol/L 3.5 3.3(L) 3.9  Chloride 101 - 111 mmol/L 105 106 110  CO2 22 - 32 mmol/L 28 24 22   Calcium 8.9 - 10.3 mg/dL 7.7(L) 7.5(L) 7.3(L)  Total Protein 6.5 - 8.1 g/dL - - -  Total Bilirubin 0.3 - 1.2 mg/dL - - -  Alkaline Phos 38 - 126 U/L - - -  AST 15 - 41 U/L - - -  ALT 17 - 63 U/L - - -   Imaging studies: No new pertinent imaging studies   Assessment/Plan: 62 y.o. male with post-surgical ileus 4 Days Post-Op s/p partial (sigmoid) colectomy with creation of end colostomy and rectal pouch for perforated sigmoid colonic diverticulitis, complicated by pertinent comorbidities including morbid obesity (BMI 32) and asthma.   - NPO, IV fluids, NG tube for now   - monitor abdominal exam and  bowel function, along with NG tube drainage   - if NG tube drainage decreases with additional flatus from colostomy, may discontinue NG tube and start clear liquids diet   - may require PICC for TPN if not able to safely remove NG tube and start clear liquids diet by tomorrow morning  - DVT prophylaxis, ambulation encouraged   All of the above findings and recommendations were discussed with the patient, his wife, and his RN, and all of patient's and  family's questions were answered to their expressed satisfaction.  -- Marilynne Drivers Rosana Hoes, MD, [place]: [facility] - Partnering for exceptional care. Office: [phone]

## 2017-12-20 NOTE — Care Management (Signed)
Patient continues with nasogastric tube to suction. Failed clamping within last 24 hours.   Remains npo. NG is draining very dark.  Wife not present during wound care session.  Patient is able to verbalize steps in changing the pouch.  May be passing some gas but no stool yet. Agreeable to have home health services and no agency preference.

## 2017-12-21 ENCOUNTER — Inpatient Hospital Stay: Payer: Self-pay

## 2017-12-21 LAB — BASIC METABOLIC PANEL
Anion gap: 8 (ref 5–15)
BUN: 23 mg/dL — AB (ref 6–20)
CHLORIDE: 104 mmol/L (ref 101–111)
CO2: 26 mmol/L (ref 22–32)
Calcium: 7.6 mg/dL — ABNORMAL LOW (ref 8.9–10.3)
Creatinine, Ser: 0.83 mg/dL (ref 0.61–1.24)
GFR calc Af Amer: >60 mL/min (ref >60)
GFR calc non Af Amer: >60 mL/min (ref >60)
GLUCOSE: 97 mg/dL (ref 65–99)
POTASSIUM: 3.1 mmol/L — AB (ref 3.5–5.1)
Sodium: 138 mmol/L (ref 135–145)

## 2017-12-21 LAB — MAGNESIUM: Magnesium: 2.2 mg/dL (ref 1.7–2.4)

## 2017-12-21 LAB — PHOSPHORUS: Phosphorus: 4.1 mg/dL (ref 2.5–4.6)

## 2017-12-21 LAB — GLUCOSE, CAPILLARY
GLUCOSE-CAPILLARY: 116 mg/dL — AB (ref 65–99)
Glucose-Capillary: 86 mg/dL (ref 65–99)
Glucose-Capillary: 114 mg/dL — ABNORMAL HIGH (ref 65–99)

## 2017-12-21 MED ORDER — INSULIN ASPART 100 UNIT/ML ~~LOC~~ SOLN
0.0000 [IU] | Freq: Four times a day (QID) | SUBCUTANEOUS | Status: DC
  Administered 2017-12-22: 2 [IU] via SUBCUTANEOUS
  Administered 2017-12-23 – 2017-12-24 (×5): 3 [IU] via SUBCUTANEOUS
  Administered 2017-12-24: 5 [IU] via SUBCUTANEOUS
  Administered 2017-12-24 (×2): 3 [IU] via SUBCUTANEOUS
  Administered 2017-12-25: 8 [IU] via SUBCUTANEOUS
  Administered 2017-12-25: 3 [IU] via SUBCUTANEOUS
  Filled 2017-12-21 (×11): qty 1

## 2017-12-21 MED ORDER — SODIUM CHLORIDE 0.9% FLUSH
10.0000 mL | INTRAVENOUS | Status: DC | PRN
  Administered 2017-12-24: 10 mL
  Filled 2017-12-21: qty 40

## 2017-12-21 MED ORDER — SODIUM CHLORIDE 0.9% FLUSH
10.0000 mL | Freq: Two times a day (BID) | INTRAVENOUS | Status: DC
  Administered 2017-12-21 – 2017-12-31 (×20): 10 mL

## 2017-12-21 MED ORDER — TRACE MINERALS CR-CU-MN-SE-ZN 10-1000-500-60 MCG/ML IV SOLN
INTRAVENOUS | Status: AC
  Administered 2017-12-21: 18:00:00 via INTRAVENOUS
  Filled 2017-12-21: qty 960

## 2017-12-21 MED ORDER — POTASSIUM CHLORIDE 10 MEQ/50ML IV SOLN
10.0000 meq | INTRAVENOUS | Status: AC
  Administered 2017-12-21 (×4): 10 meq via INTRAVENOUS
  Filled 2017-12-21 (×4): qty 50

## 2017-12-21 NOTE — Progress Notes (Signed)
Brief Nutrition Note  Consult received for parenteral nutrition. Plan for PICC line placement.  Pharmacy to initiate Clinimix 5%AA/15%Dextrose at 66ml/hr with MVI and Trace Minerals at this time.  Full assessment and further recommendations to follow.  Admitting Dx: Diverticulitis of large intestine with perforation without bleeding [K57.20]  Body mass index is 31.75 kg/m. Pt meets criteria for class 1 obesity based on current BMI.  Labs:  Recent Labs  Lab 12/17/17 0509 12/18/17 0524 12/19/17 0523 12/20/17 0517 12/21/17 0440  NA 134* 137 136 139 138  K 4.4 3.9 3.3* 3.5 3.1*  CL 108 110 106 105 104  CO2 20* 22 24 28 26   BUN 18 13 17  22* 23*  CREATININE 1.20 0.85 1.00 1.03 0.83  CALCIUM 7.2* 7.3* 7.5* 7.7* 7.6*  MG 1.8 1.9 1.8  --  2.2  PHOS 3.2  --   --   --  4.1    Corrin Parker, MS, RD, LDN Pager # [phone] After hours/ weekend pager # [phone]

## 2017-12-21 NOTE — Progress Notes (Signed)
PHARMACY - ADULT TOTAL PARENTERAL NUTRITION CONSULT NOTE   Pharmacy Consult for TPN  Indication: Prolonged NPO, patient with post surgical ileus   Patient Measurements: Height: 5\' 9"  (175.3 cm) Weight: 215 lb (97.5 kg) IBW/kg (Calculated) : 70.7 TPN AdjBW (KG): 77.4 Body mass index is 31.75 kg/m.   Assessment:   Pharmacy consulted for TPN management for 62 yo male s/p partial colectomy, now with postsurgical ileus.   TPN Access: PICC to be placed 4/27 TPN start date: 4/27    Current Nutrition: NPO  Plan:  Clinimix E 5/15 at 38mL/hr.   Potassium 65mEq IV Q1 hr x 4 doses.   SSI Q6hr.   Will obtain labs in am and as indicated.   Pharmacy will continue to monitor and adjust per consult.    Simpson,Michael L 12/21/2017,2:38 PM

## 2017-12-21 NOTE — Progress Notes (Signed)
[facility] Hospital Day(s): 5.   Post op day(s): 5 Days Post-Op.   Interval History: Patient seen and examined, no acute events or new complaints overnight. Patient reports a few additional "bursts" of air from his colostomy, but an additional 700 mL of thick, dark green fluid has drained from his NG tube over the past 12 hours. Patient otherwise denies abdominal pain, N/V, fever/chills, CP, or SOB, and has ambulated in the halls with assist.  Review of Systems:  Constitutional: denies fever, chills  Respiratory: denies any shortness of breath  Cardiovascular: denies chest pain or palpitations  Gastrointestinal: abdominal pain, N/V, and bowel function as per interval history Musculoskeletal: denies pain, decreased motor or sensation Integumentary: denies any other rashes or skin discolorations except post-surgical abdominal wounds  Vital signs in last 24 hours: [min-max] current  Temp:  [97.7 F (36.5 C)-98.4 F (36.9 C)] 97.7 F (36.5 C) (04/27 0451) Pulse Rate:  [86-95] 86 (04/27 0451) Resp:  [16-20] 20 (04/27 0451) BP: (125-132)/(75-77) 129/77 (04/27 0451) SpO2:  [94 %-95 %] 95 % (04/27 0451)     Height: 5\' 9"  (175.3 cm) Weight: 215 lb (97.5 kg) BMI (Calculated): 31.74   Intake/Output this shift:  No intake/output data recorded.   Intake/Output last 2 shifts:  @IOLAST2SHIFTS @   Physical Exam:  Constitutional: alert, cooperative and no distress  Respiratory: breathing non-labored at rest  Cardiovascular: regular rate and sinus rhythm  Gastrointestinal: abdomen soft and non-distended with post-surgical midline open abdominal wound without surrounding erythema or drainage, mild peri-incisional tenderness to palpation, colostomy dusky but viable, empty ostomy bag, JP with small volume of serosanguinous fluid in tubing and bulb  Labs:  CBC Latest Ref Rng & Units 12/19/2017 12/18/2017 12/17/2017  WBC 3.8 - 10.6 K/uL 8.3 7.7 8.3  Hemoglobin 13.0 - 18.0 g/dL 11.7(L)  12.4(L) 15.3  Hematocrit 40.0 - 52.0 % 33.6(L) 36.6(L) 44.9  Platelets 150 - 440 K/uL 191 169 232   CMP Latest Ref Rng & Units 12/21/2017 12/20/2017 12/19/2017  Glucose 65 - 99 mg/dL 97 107(H) 145(H)  BUN 6 - 20 mg/dL 23(H) 22(H) 17  Creatinine 0.61 - 1.24 mg/dL 0.83 1.03 1.00  Sodium 135 - 145 mmol/L 138 139 136  Potassium 3.5 - 5.1 mmol/L 3.1(L) 3.5 3.3(L)  Chloride 101 - 111 mmol/L 104 105 106  CO2 22 - 32 mmol/L 26 28 24   Calcium 8.9 - 10.3 mg/dL 7.6(L) 7.7(L) 7.5(L)  Total Protein 6.5 - 8.1 g/dL - - -  Total Bilirubin 0.3 - 1.2 mg/dL - - -  Alkaline Phos 38 - 126 U/L - - -  AST 15 - 41 U/L - - -  ALT 17 - 63 U/L - - -   Imaging studies: No new pertinent imaging studies   Assessment/Plan: 62 y.o. male with post-surgical ileus D Days Post-Op s/p partial (sigmoid) colectomy with creation of end colostomy and rectal pouch for perforated sigmoid colonic diverticulitis, complicated by pertinent comorbidities including morbid obesity (BMI 32) and asthma.              - NPO, IV fluids, NG tube for now              - monitor abdominal exam and bowel function, along with NG tube drainage              - considering prolonged NPO and uncertain duration for recovering post-surgical ileus, will order PICC for TPN  - when post-surgical ileus resolves, can remove NG tube, advance  diet, and wean TPN  - if ileus persists, may consider follow-up CT imaging to assess for abscess             - medical management of medical comorbidities             - DVT prophylaxis, ambulation encouraged   All of the above findings and recommendations were discussed with the patient, his wife, and his RN, and all of patient's and family's questions were answered to their expressed satisfaction.  -- Marilynne Drivers Rosana Hoes, MD, [place]: [facility] - Partnering for exceptional care. Office: [phone]

## 2017-12-21 NOTE — Progress Notes (Signed)
Spoke with Minette Brine RN, states pt needs PICC today for TNA.  Notified to call [facility] for PICC placement.

## 2017-12-21 NOTE — Progress Notes (Signed)
RN contacted [facility] for placement of PICC line today.

## 2017-12-22 ENCOUNTER — Inpatient Hospital Stay: Payer: BC Managed Care – PPO

## 2017-12-22 ENCOUNTER — Encounter: Payer: Self-pay | Admitting: Radiology

## 2017-12-22 LAB — CBC
HEMATOCRIT: 29 % — AB (ref 40.0–52.0)
Hemoglobin: 9.8 g/dL — ABNORMAL LOW (ref 13.0–18.0)
MCH: 30.9 pg (ref 26.0–34.0)
MCHC: 33.9 g/dL (ref 32.0–36.0)
MCV: 91.2 fL (ref 80.0–100.0)
PLATELETS: 304 10*3/uL (ref 150–440)
RBC: 3.18 MIL/uL — ABNORMAL LOW (ref 4.40–5.90)
RDW: 13.7 % (ref 11.5–14.5)
WBC: 11.8 10*3/uL — ABNORMAL HIGH (ref 3.8–10.6)

## 2017-12-22 LAB — DIFFERENTIAL
BASOS PCT: 1 %
Basophils Absolute: 0.1 10*3/uL (ref 0–0.1)
EOS PCT: 2 %
Eosinophils Absolute: 0.3 10*3/uL (ref 0–0.7)
Lymphocytes Relative: 7 %
Lymphs Abs: 0.9 10*3/uL — ABNORMAL LOW (ref 1.0–3.6)
MONO ABS: 0.7 10*3/uL (ref 0.2–1.0)
MONOS PCT: 6 %
Neutro Abs: 10 10*3/uL — ABNORMAL HIGH (ref 1.4–6.5)
Neutrophils Relative %: 84 %

## 2017-12-22 LAB — COMPREHENSIVE METABOLIC PANEL
ALBUMIN: 2.3 g/dL — AB (ref 3.5–5.0)
ALT: 11 U/L — AB (ref 17–63)
AST: 20 U/L (ref 15–41)
Alkaline Phosphatase: 35 U/L — ABNORMAL LOW (ref 38–126)
Anion gap: 7 (ref 5–15)
BUN: 17 mg/dL (ref 6–20)
CO2: 26 mmol/L (ref 22–32)
Calcium: 7.5 mg/dL — ABNORMAL LOW (ref 8.9–10.3)
Chloride: 105 mmol/L (ref 101–111)
Creatinine, Ser: 0.76 mg/dL (ref 0.61–1.24)
GFR calc Af Amer: >60 mL/min (ref >60)
GLUCOSE: 134 mg/dL — AB (ref 65–99)
POTASSIUM: 3.3 mmol/L — AB (ref 3.5–5.1)
Sodium: 138 mmol/L (ref 135–145)
TOTAL PROTEIN: 5.5 g/dL — AB (ref 6.5–8.1)
Total Bilirubin: 0.5 mg/dL (ref 0.3–1.2)

## 2017-12-22 LAB — MAGNESIUM: MAGNESIUM: 2 mg/dL (ref 1.7–2.4)

## 2017-12-22 LAB — TRIGLYCERIDES: Triglycerides: 120 mg/dL (ref <150)

## 2017-12-22 LAB — GLUCOSE, CAPILLARY
GLUCOSE-CAPILLARY: 114 mg/dL — AB (ref 65–99)
GLUCOSE-CAPILLARY: 133 mg/dL — AB (ref 65–99)
GLUCOSE-CAPILLARY: 106 mg/dL — AB (ref 65–99)
Glucose-Capillary: 132 mg/dL — ABNORMAL HIGH (ref 65–99)

## 2017-12-22 LAB — PHOSPHORUS: Phosphorus: 3.4 mg/dL (ref 2.5–4.6)

## 2017-12-22 MED ORDER — FAT EMULSION PLANT BASED 20 % IV EMUL
250.0000 mL | INTRAVENOUS | Status: AC
  Administered 2017-12-22: 250 mL via INTRAVENOUS
  Filled 2017-12-22: qty 250

## 2017-12-22 MED ORDER — ACETAMINOPHEN 10 MG/ML IV SOLN
1000.0000 mg | Freq: Four times a day (QID) | INTRAVENOUS | Status: AC
  Administered 2017-12-22 – 2017-12-23 (×4): 1000 mg via INTRAVENOUS
  Filled 2017-12-22 (×4): qty 100

## 2017-12-22 MED ORDER — IOPAMIDOL (ISOVUE-370) INJECTION 76%
100.0000 mL | Freq: Once | INTRAVENOUS | Status: AC | PRN
  Administered 2017-12-22: 100 mL via INTRAVENOUS

## 2017-12-22 MED ORDER — TRACE MINERALS CR-CU-MN-SE-ZN 10-1000-500-60 MCG/ML IV SOLN
INTRAVENOUS | Status: AC
  Administered 2017-12-22: 19:00:00 via INTRAVENOUS
  Filled 2017-12-22: qty 1992

## 2017-12-22 MED ORDER — ACETAMINOPHEN 10 MG/ML IV SOLN
1000.0000 mg | Freq: Once | INTRAVENOUS | Status: AC
  Administered 2017-12-22: 1000 mg via INTRAVENOUS
  Filled 2017-12-22: qty 100

## 2017-12-22 MED ORDER — POTASSIUM CHLORIDE 10 MEQ/50ML IV SOLN
10.0000 meq | INTRAVENOUS | Status: AC
  Administered 2017-12-22 (×2): 10 meq via INTRAVENOUS
  Filled 2017-12-22 (×2): qty 50

## 2017-12-22 MED ORDER — AMITRIPTYLINE HCL 25 MG PO TABS
25.0000 mg | ORAL_TABLET | Freq: Every evening | ORAL | Status: AC | PRN
  Administered 2017-12-22: 25 mg via ORAL
  Filled 2017-12-22: qty 1

## 2017-12-22 MED ORDER — IOPAMIDOL (ISOVUE-300) INJECTION 61%
15.0000 mL | INTRAVENOUS | Status: AC
  Administered 2017-12-22 (×2): 15 mL via ORAL

## 2017-12-22 NOTE — Progress Notes (Addendum)
ADDENDUM: CT Abdomen and Pelvis with Contrast (12/22/2017) - personally reviewed and discussed with patient  Enteric tube terminates in the stomach. Oral  contrast material throughout the small and large bowel.  1. Postsurgical changes compatible with interval laparotomy and sigmoid colectomy. Left lower anterior abdominal colostomy. 2. There are at least 3 small fluid collections within the small bowel mesentery which may be postsurgical in etiology or represent postoperative abscess formation. Additionally, there is a small amount of fluid throughout the small bowel mesentery. Surgical drains course of the lower anterior abdomen. 3. Indeterminate renal lesion superior pole right kidney. After resolution of the acute symptomatology, recommend dedicated evaluation with pre and post contrast-enhanced MRI. 4. Moderate layering bilateral pleural effusions with underlying consolidation favored to represent atelectasis.  Considering ongoing drainage from NG tube and not more than scant fluid and gas into colostomy bag in the context of contrast nearly reaching colostomy with non-dilated bowel and patient having tolerated 2 full bottles of contrast with NG tube clamped x 4 hours, continuation of NG tube with reassessment tomorrow vs additional clamp trial vs removal of NG tube with gradually starting clear liquids were discussed. Patient requests removal of NG tube with understanding can't guarantee won't require reinsertion.  Discussed with patient's RN and orders placed.  -- Marilynne Drivers Rosana Hoes, MD, [place]: [facility] - Partnering for exceptional care. Office: [facility] Hospital Day(s): 6.   Post op day(s): 6 Days Post-Op.   Interval History: Patient seen and examined, no acute events or new complaints overnight. Patient reports lower abdominal "rumbling" with mild lower abdominal pain, denies N/V, fever/chills,  CP, or SOB. He says he's only been walking once per day.  Review of Systems:  Constitutional: denies fever, chills  Respiratory: denies any shortness of breath  Cardiovascular: denies chest pain or palpitations  Gastrointestinal: abdominal pain, N/V, and bowel function as per interval history Musculoskeletal: denies pain, decreased motor or sensation Integumentary: denies any other rashes or skin discolorations except post-surgical abdominal wounds  Vital signs in last 24 hours: [min-max] current  Temp:  [98.1 F (36.7 C)-98.5 F (36.9 C)] 98.1 F (36.7 C) (04/28 0539) Pulse Rate:  [79-86] 79 (04/28 0539) Resp:  [20] 20 (04/27 1158) BP: (128-140)/(73-78) 128/73 (04/28 0539) SpO2:  [95 %-97 %] 95 % (04/28 0539) Weight:  [213 lb 13.5 oz (97 kg)] 213 lb 13.5 oz (97 kg) (04/28 0905)     Height: 5\' 9"  (175.3 cm) Weight: 213 lb 13.5 oz (97 kg)(bed scale) BMI (Calculated): 31.57   Intake/Output this shift:  Total I/O In: 2957.5 [I.V.:2957.5] Out: 190 [Emesis/NG output:150; Drains:40]   Intake/Output last 2 shifts:  @IOLAST2SHIFTS @   Physical Exam:  Constitutional: alert, cooperative and no distress  Respiratory: breathing non-labored at rest  Cardiovascular: regular rate and sinus rhythm  Gastrointestinal: abdomen soft and non-distended with post-surgical midline open abdominal wound without surrounding erythema or drainage, mild peri-incisional tenderness to palpation, colostomy dusky but viable, ostomy bag with small amount of thin fluid ("sweating"), JP with small volume of serosanguinous fluid in tubing and bulb  Labs:  CBC Latest Ref Rng & Units 12/22/2017 12/19/2017 12/18/2017  WBC 3.8 - 10.6 K/uL 11.8(H) 8.3 7.7  Hemoglobin 13.0 - 18.0 g/dL 9.8(L) 11.7(L) 12.4(L)  Hematocrit 40.0 - 52.0 % 29.0(L) 33.6(L) 36.6(L)  Platelets 150 - 440 K/uL 304 191 169   CMP Latest Ref Rng & Units 12/22/2017 12/21/2017 12/20/2017  Glucose 65 -  99 mg/dL 134(H) 97 107(H)  BUN 6 - 20 mg/dL 17 23(H)  22(H)  Creatinine 0.61 - 1.24 mg/dL 0.76 0.83 1.03  Sodium 135 - 145 mmol/L 138 138 139  Potassium 3.5 - 5.1 mmol/L 3.3(L) 3.1(L) 3.5  Chloride 101 - 111 mmol/L 105 104 105  CO2 22 - 32 mmol/L 26 26 28   Calcium 8.9 - 10.3 mg/dL 7.5(L) 7.6(L) 7.7(L)  Total Protein 6.5 - 8.1 g/dL 5.5(L) - -  Total Bilirubin 0.3 - 1.2 mg/dL 0.5 - -  Alkaline Phos 38 - 126 U/L 35(L) - -  AST 15 - 41 U/L 20 - -  ALT 17 - 63 U/L 11(L) - -   Imaging studies: No new pertinent imaging studies   Assessment/Plan: 62 y.o.malewith post-surgical ileus6 Days Post-Ops/p partial (sigmoid) colectomy with creation of end colostomy and rectal pouchfor perforated sigmoid colonic diverticulitis, complicated by pertinent comorbidities includingmorbid obesity (BMI 32) and asthma.  - NPO, TPN, NG tube for now - monitor abdominal exam and bowel function, along with NG tube drainage             - when post-surgical ileus resolves, can remove NG tube, advance diet, and wean TPN             - will check CT with contrast via NG tube to evaluate for abscess(es) or other pathology contributing to prolonged ileus - medical management of medical comorbidities - DVT prophylaxis, ambulation encouraged  All of the above findings and recommendations were discussed with the patient, his wife,and his RN,and all of patient's and family's questions were answered to their expressed satisfaction.  -- Marilynne Drivers Rosana Hoes, MD, [place]: [facility] - Partnering for exceptional care. Office: [phone]

## 2017-12-22 NOTE — Progress Notes (Signed)
PHARMACY - ADULT TOTAL PARENTERAL NUTRITION CONSULT NOTE   Pharmacy Consult for TPN  Indication: Prolonged NPO, patient with post surgical ileus   Patient Measurements: Height: 5\' 9"  (175.3 cm) Weight: 213 lb 13.5 oz (97 kg)(bed scale) IBW/kg (Calculated) : 70.7 TPN AdjBW (KG): 77.4 Body mass index is 31.58 kg/m.   Assessment:   Pharmacy consulted for TPN management for 62 yo male s/p partial colectomy, now with postsurgical ileus.   TPN Access: PICC to be placed 4/27 TPN start date: 4/27  Current Nutrition: NPO  Plan:  Advance to goal TPN regimen of Clinimix E 5/20 at 83 mL/hr + 20% ILE at 20 mL/hr over 12 hours. Provides 2233 kcal, 100 grams of protein, 2232 mL fluid daily including lipids.  Provide adult MVI and trace elements as daily TPN additives.  Monitor daily weights with TPN.  Plan is to discontinue lactated ringers infusion.  Potassium 68mEq IV Q1 hr x 2 doses.   SSI Q6hr.   Will obtain labs in am and as indicated.   Pharmacy will continue to monitor and adjust per consult.    Luz Mares K, RPH 12/22/2017,9:57 AM

## 2017-12-22 NOTE — Progress Notes (Signed)
Initial Nutrition Assessment  DOCUMENTATION CODES:   Obesity unspecified  INTERVENTION:  Advance to goal TPN regimen of Clinimix E 5/20 at 83 mL/hr + 20% ILE at 20 mL/hr over 12 hours. Provides 2233 kcal, 100 grams of protein, 2232 mL fluid daily including lipids.  Provide adult MVI and trace elements as daily TPN additives.  Monitor daily weights with TPN.  Plan is to discontinue lactated ringers infusion.  NUTRITION DIAGNOSIS:   Increased nutrient needs related to post-op healing as evidenced by estimated needs.  GOAL:   Patient will meet greater than or equal to 90% of their needs  MONITOR:   Diet advancement, Labs, Weight trends, Skin, I & O's  REASON FOR ASSESSMENT:   Consult New TPN/TNA  ASSESSMENT:   62 year old male with PMHx of asthma, diverticulitis who was admitted with acute left lower quadrant pain found to have perforated diverticulitis s/p exploratory laparotomy with sigmoid colectomy and creation of colostomy on 12/16/2017.   -Patient now with post-operative ileus.  Met with patient at bedside. He reports that PTA he had a fairly good appetite. He typically eats 2 meals per day. For lunch he has a sandwich or soup. For dinner he usually has a meat with vegetables/sides. He reports first experiencing the LLQ abdominal pain on 4/18 but it worsened on 4/19. He presented to outpatient clinic on 4/20 and then was admitted to the hospital on 4/22. He was NPO after surgery. Patient was started on CLD trial on 4/25 after clamping NGT. He reports trying some broth and juice but felt pain and was made NPO again. He reports having some nausea this morning, but it improved after PRN nausea medication. Also having some lower abdominal pain. There was almost 700 mL output from NGT at time of RD assessment and patient reports that cannister had been hanging approximately 12 hours.  Discussed reasoning for TPN and components of TPN. Patient with no nutrition questions at this  time.  Patient reports his UBW is around 213-215 lbs. RD obtained bed scale weight of exactly 97 kg (213.4 lbs). Will continue to monitor weight trend on TPN.  IV Access: right basilic triple lumen PICC placed 12/21/2017; ECG used to determine PICC terminates at [facility]  TPN: pt was started on Clinimix E 5/15 at 40 mL/hr with adult MVI and trace elements  Medications reviewed and include: Lovenox, Novolog 0-15 units Q6hrs (none received past 24 hrs), ciprofloxacin, famotidine, LR @ 100 mL/hr, Flagyl, potassium chloride 10 mEq IV 2 times today.  Labs reviewed: CBG 86-132, Potassium 3.3. Phosphorus and Magnesium WNL. Triglycerides 120.  I/O: 1350 mL UOP yesterday (0.6 mL/kg/hr); 850 mL output from NGT yesterday; 15 mL output from left abdominal drain yesterday; 30 mL output from right abdominal drain yesterday; 0 mL output from colostomy  Patient does not meet criteria for malnutrition at this time but is at risk for development of acute malnutrition.  Discussed with RN. Also discussed with Surgeon. Plan is to discontinue LR at 100 mL/hr.  NUTRITION - FOCUSED PHYSICAL EXAM:    Most Recent Value  Orbital Region  No depletion  Upper Arm Region  No depletion  Thoracic and Lumbar Region  No depletion  Buccal Region  No depletion  Temple Region  No depletion  Clavicle Bone Region  No depletion  Clavicle and Acromion Bone Region  No depletion  Scapular Bone Region  No depletion  Dorsal Hand  No depletion  Patellar Region  No depletion  Anterior Thigh Region  No depletion  Posterior Calf Region  No depletion  Edema (RD Assessment)  -- [non-pitting edema to bilateral lower extremities]  Hair  Reviewed  Eyes  Reviewed  Mouth  Reviewed  Skin  Reviewed  Nails  Reviewed     Diet Order:  Diet NPO time specified Except for: Ice Chips .TPN (CLINIMIX-E) Adult  EDUCATION NEEDS:   Education needs have been addressed  Skin:  Skin Assessment: Skin Integrity Issues: Skin Integrity Issues::  Incisions Incisions: closed incision to abdomen  Last BM:  12/16/2017; pt now with colostomy  Height:   Ht Readings from Last 1 Encounters:  12/16/17 '5\' 9"'$  (1.753 m)    Weight:   Wt Readings from Last 1 Encounters:  12/22/17 213 lb 13.5 oz (97 kg)    Ideal Body Weight:  72.7 kg  BMI:  Body mass index is 31.58 kg/m.  Estimated Nutritional Needs:   Kcal:  2130-2310 (MSJ x 1.2-1.3)  Protein:  105-125 grams (1.1-1.3 grams/kg)  Fluid:  1.8-2.2 L/day (25-30 kcal/kg IBW)  Willey Blade, MS, RD, LDN Office: [phone] Pager: [phone] After Hours/Weekend Pager: [phone]

## 2017-12-23 LAB — PREALBUMIN: Prealbumin: 8.1 mg/dL — ABNORMAL LOW (ref 18–38)

## 2017-12-23 LAB — CBC
HEMATOCRIT: 35.8 % — AB (ref 40.0–52.0)
Hemoglobin: 12.6 g/dL — ABNORMAL LOW (ref 13.0–18.0)
MCH: 31.9 pg (ref 26.0–34.0)
MCHC: 35.2 g/dL (ref 32.0–36.0)
MCV: 90.7 fL (ref 80.0–100.0)
PLATELETS: 328 10*3/uL (ref 150–440)
RBC: 3.94 MIL/uL — ABNORMAL LOW (ref 4.40–5.90)
RDW: 13.9 % (ref 11.5–14.5)
WBC: 12.9 10*3/uL — ABNORMAL HIGH (ref 3.8–10.6)

## 2017-12-23 LAB — DIFFERENTIAL
BASOS PCT: 0 %
Basophils Absolute: 0.1 10*3/uL (ref 0–0.1)
EOS ABS: 0.4 10*3/uL (ref 0–0.7)
Eosinophils Relative: 3 %
Lymphocytes Relative: 10 %
Lymphs Abs: 1.3 10*3/uL (ref 1.0–3.6)
MONO ABS: 0.5 10*3/uL (ref 0.2–1.0)
MONOS PCT: 4 %
Neutro Abs: 10.6 10*3/uL — ABNORMAL HIGH (ref 1.4–6.5)
Neutrophils Relative %: 83 %

## 2017-12-23 LAB — COMPREHENSIVE METABOLIC PANEL
ALBUMIN: 2.3 g/dL — AB (ref 3.5–5.0)
ALK PHOS: 34 U/L — AB (ref 38–126)
ALT: 10 U/L — AB (ref 17–63)
AST: 16 U/L (ref 15–41)
Anion gap: 6 (ref 5–15)
BILIRUBIN TOTAL: 0.4 mg/dL (ref 0.3–1.2)
BUN: 12 mg/dL (ref 6–20)
CALCIUM: 7.4 mg/dL — AB (ref 8.9–10.3)
CO2: 25 mmol/L (ref 22–32)
Chloride: 106 mmol/L (ref 101–111)
Creatinine, Ser: 0.79 mg/dL (ref 0.61–1.24)
GFR calc Af Amer: >60 mL/min (ref >60)
GFR calc non Af Amer: >60 mL/min (ref >60)
GLUCOSE: 183 mg/dL — AB (ref 65–99)
Potassium: 3.2 mmol/L — ABNORMAL LOW (ref 3.5–5.1)
SODIUM: 137 mmol/L (ref 135–145)
TOTAL PROTEIN: 5.4 g/dL — AB (ref 6.5–8.1)

## 2017-12-23 LAB — GLUCOSE, CAPILLARY
GLUCOSE-CAPILLARY: 164 mg/dL — AB (ref 65–99)
GLUCOSE-CAPILLARY: 151 mg/dL — AB (ref 65–99)
Glucose-Capillary: 169 mg/dL — ABNORMAL HIGH (ref 65–99)
Glucose-Capillary: 178 mg/dL — ABNORMAL HIGH (ref 65–99)
Glucose-Capillary: 183 mg/dL — ABNORMAL HIGH (ref 65–99)

## 2017-12-23 LAB — PHOSPHORUS: Phosphorus: 3.8 mg/dL (ref 2.5–4.6)

## 2017-12-23 LAB — TRIGLYCERIDES: Triglycerides: 122 mg/dL (ref <150)

## 2017-12-23 LAB — MAGNESIUM: Magnesium: 2.1 mg/dL (ref 1.7–2.4)

## 2017-12-23 MED ORDER — FAT EMULSION PLANT BASED 20 % IV EMUL
250.0000 mL | INTRAVENOUS | Status: AC
  Administered 2017-12-23: 250 mL via INTRAVENOUS
  Filled 2017-12-23: qty 250

## 2017-12-23 MED ORDER — AMITRIPTYLINE HCL 25 MG PO TABS
25.0000 mg | ORAL_TABLET | Freq: Once | ORAL | Status: AC
  Administered 2017-12-23: 25 mg via ORAL
  Filled 2017-12-23: qty 1

## 2017-12-23 MED ORDER — TRACE MINERALS CR-CU-MN-SE-ZN 10-1000-500-60 MCG/ML IV SOLN
INTRAVENOUS | Status: AC
  Administered 2017-12-23: 17:00:00 via INTRAVENOUS
  Filled 2017-12-23: qty 1992

## 2017-12-23 MED ORDER — POTASSIUM CHLORIDE 10 MEQ/100ML IV SOLN
10.0000 meq | INTRAVENOUS | Status: AC
  Administered 2017-12-23 (×4): 10 meq via INTRAVENOUS
  Filled 2017-12-23 (×4): qty 100

## 2017-12-23 NOTE — Progress Notes (Signed)
PHARMACY - ADULT TOTAL PARENTERAL NUTRITION CONSULT NOTE   Pharmacy Consult for TPN  Indication: Prolonged NPO, patient with post surgical ileus   Patient Measurements: Height: 5\' 9"  (175.3 cm) Weight: 213 lb 13.5 oz (97 kg)(bed scale) IBW/kg (Calculated) : 70.7 TPN AdjBW (KG): 77.4 Body mass index is 31.58 kg/m.   Assessment:   Pharmacy consulted for TPN management for 62 yo male s/p partial colectomy, now with postsurgical ileus.   TPN Access: PICC placed 4/27 TPN start date: 4/27  Current Nutrition: NPO  Plan:  Continue TPN regimen of Clinimix E 5/20 at 83 mL/hr + 20% ILE at 20 mL/hr over 12 hours. Provides 2233 kcal, 100 grams of protein, 2232 mL fluid daily including lipids. This is goal.  Provide adult MVI and trace elements as daily TPN additives.  Monitor daily weights with TPN.  Plan is to discontinue lactated ringers infusion.  Potassium 50mEq IV Q1 hr x 4 doses.   SSI Q6hr.   Will obtain labs in am and as indicated.   Pharmacy will continue to monitor and adjust per consult.    Laural Benes, RPH 12/23/2017,7:49 AM

## 2017-12-23 NOTE — Progress Notes (Signed)
7 Days Post-Op  Subjective: Patient status post Hartman's procedure.  His nasogastric tube was removed last night.  He has no further nausea or vomiting.  He is still distended and not placing much out into his colostomy bag.  Drains are in place.  He is tolerating ice water and ice chips.  Objective: Vital signs in last 24 hours: Temp:  [97.7 F (36.5 C)-99.4 F (37.4 C)] 98.4 F (36.9 C) (04/29 0520) Pulse Rate:  [81-85] 85 (04/29 0520) Resp:  [16-20] 20 (04/29 0520) BP: (122-139)/(70-77) 122/71 (04/29 0520) SpO2:  [94 %-98 %] 94 % (04/29 0520) Weight:  [213 lb 13.5 oz (97 kg)] 213 lb 13.5 oz (97 kg) (04/28 0905) Last BM Date: 12/16/17  Intake/Output from previous day: 04/28 0701 - 04/29 0700 In: 5105.4 [I.V.:4675.4; NG/GT:30; IV Piggyback:400] Out: 2240 [Urine:1800; Emesis/NG output:300; Drains:140] Intake/Output this shift: Total I/O In: -  Out: 600 [Urine:600]  Physical exam:  Awake alert and oriented vital signs reviewed and stable abdomen is distended nontympanitic nontender midline wound is open and granulating.  Colostomy bag has no stool within it and no gas.  It is pink and viable.  Drains are in place.  Lab Results: CBC  Recent Labs    12/22/17 0544 12/23/17 0654  WBC 11.8* 12.9*  HGB 9.8* 12.6*  HCT 29.0* 35.8*  PLT 304 328   BMET Recent Labs    12/22/17 0544 12/23/17 0654  NA 138 137  K 3.3* 3.2*  CL 105 106  CO2 26 25  GLUCOSE 134* 183*  BUN 17 12  CREATININE 0.76 0.79  CALCIUM 7.5* 7.4*   PT/INR No results for input(s): LABPROT, INR in the last 72 hours. ABG No results for input(s): PHART, HCO3 in the last 72 hours.  Invalid input(s): PCO2, PO2  Studies/Results: Ct Abdomen Pelvis W Contrast  Result Date: 12/22/2017 CLINICAL DATA:  Patient status post partial colectomy for perforated sigmoid colonic diverticulitis. EXAM: CT ABDOMEN AND PELVIS WITH CONTRAST TECHNIQUE: Multidetector CT imaging of the abdomen and pelvis was performed using  the standard protocol following bolus administration of intravenous contrast. CONTRAST:  134mL ISOVUE-370 IOPAMIDOL (ISOVUE-370) INJECTION 76% COMPARISON:  CT abdomen pelvis 12/16/2017 FINDINGS: Lower chest: Normal heart size. Small bilateral pleural effusions. Subpleural consolidation lower lobes bilaterally, favored to represent atelectasis. Hepatobiliary: The liver is normal in size and contour. Subcentimeter too small to characterize low-attenuation lesions within the hepatic dome and left hepatic lobe re-demonstrated. Dense material demonstrated within the gallbladder lumen. No intrahepatic or extrahepatic biliary ductal dilatation. Pancreas: Mild atrophy of the pancreatic parenchyma. Spleen: Unremarkable Adrenals/Urinary Tract: Adrenal glands are normal. 4.2 cm simple cyst superior pole left kidney. 5.5 cm simple cyst superior pole right kidney. Additionally within the superior pole of the right kidney there is a 2.5 x 2.5 cm dense lesion (image 27; series 2) with an internal density of 56 Hounsfield units. Additional subcentimeter bilateral low-attenuation renal lesions are demonstrated, too small to characterize. Small amount of gas within the urinary bladder. Stomach/Bowel: Left lower quadrant colostomy. Small amount of fluid within the small bowel mesentery. Within the anterior central small bowel mesentery there is a 2.6 x 2.0 cm peripherally enhancing fluid collection (image 47; series 2). Within the right hemiabdomen small bowel mesentery there is a 2.4 x 1.7 cm peripherally enhancing fluid collection (image 53; series 2). Loculated fluid is demonstrated within the central small bowel mesentery measuring up to 2.9 x 1.9 cm (image 56; series 2). Enteric tube terminates in the stomach. Oral  contrast material throughout the small and large bowel. Mild wall thickening of the distal small bowel. Few tiny foci of free intraperitoneal air, likely postprocedural in etiology. Surgical drains are demonstrated  coursing through the lower mid abdomen. Vascular/Lymphatic: Normal caliber abdominal aorta. Peripheral calcified atherosclerotic plaque. No retroperitoneal lymphadenopathy. Reproductive: Prostate is unremarkable. Other: Bilateral fat containing inguinal hernias, right greater than left. Midline surgical wound. Musculoskeletal: Lumbar spine degenerative changes. No aggressive or acute appearing osseous lesions. IMPRESSION: 1. Postsurgical changes compatible with interval laparotomy and sigmoid colectomy. Left lower anterior abdominal colostomy. 2. There are at least 3 small fluid collections within the small bowel mesentery which may be postsurgical in etiology or represent postoperative abscess formation. Additionally, there is a small amount of fluid throughout the small bowel mesentery. Surgical drains course of the lower anterior abdomen. 3. Indeterminate renal lesion superior pole right kidney. After resolution of the acute symptomatology, recommend dedicated evaluation with pre and post contrast-enhanced MRI. 4. Moderate layering bilateral pleural effusions with underlying consolidation favored to represent atelectasis. Infection not excluded. 5. These results will be called to the ordering clinician or representative by the Radiologist Assistant, and communication documented in the PACS or zVision Dashboard. Electronically Signed   By: Lovey Newcomer M.D.   On: 12/22/2017 17:53   [place] Ekg Site Rite  Result Date: 12/21/2017 If Site Rite image not attached, placement could not be confirmed due to current cardiac rhythm.   Anti-infectives: Anti-infectives (From admission, onward)   Start     Dose/Rate Route Frequency Ordered Stop   12/17/17 0400  metroNIDAZOLE (FLAGYL) IVPB 500 mg     500 mg 100 mL/hr over 60 Minutes Intravenous Every 8 hours 12/16/17 2148     12/17/17 0100  ciprofloxacin (CIPRO) IVPB 400 mg     400 mg 200 mL/hr over 60 Minutes Intravenous Every 12 hours 12/16/17 2148     12/16/17 2000   metroNIDAZOLE (FLAGYL) IVPB 500 mg  Status:  Discontinued     500 mg 100 mL/hr over 60 Minutes Intravenous  Once 12/16/17 1951 12/16/17 2253   12/16/17 1245  ciprofloxacin (CIPRO) IVPB 400 mg     400 mg 200 mL/hr over 60 Minutes Intravenous  Once 12/16/17 1230 12/16/17 1416   12/16/17 1245  metroNIDAZOLE (FLAGYL) IVPB 500 mg     500 mg 100 mL/hr over 60 Minutes Intravenous  Once 12/16/17 1230 12/16/17 1517      Assessment/Plan: s/p Procedure(s): EXPLORATORY LAPAROTOMY/ SIGMOID COLECTOMY, colostomy   White blood cell count is elevated.  He has had a recent CT scan showing fluid collections.  He is experiencing a postoperative ileus and a nasogastric tube is been removed.  He has been started on clear liquids this morning but has yet to receive his tray.  I suggested he not drink the soup at this time but sip liquids until we are sure that his ileus is resolving.  He is on TPN and electrolytes will be managed through the TPN.  Florene Glen, MD, FACS  12/23/2017

## 2017-12-24 LAB — BASIC METABOLIC PANEL
Anion gap: 6 (ref 5–15)
BUN: 13 mg/dL (ref 6–20)
CO2: 24 mmol/L (ref 22–32)
CREATININE: 0.74 mg/dL (ref 0.61–1.24)
Calcium: 7.5 mg/dL — ABNORMAL LOW (ref 8.9–10.3)
Chloride: 104 mmol/L (ref 101–111)
GFR calc Af Amer: >60 mL/min (ref >60)
GFR calc non Af Amer: >60 mL/min (ref >60)
GLUCOSE: 204 mg/dL — AB (ref 65–99)
POTASSIUM: 3.8 mmol/L (ref 3.5–5.1)
SODIUM: 134 mmol/L — AB (ref 135–145)

## 2017-12-24 LAB — MAGNESIUM: MAGNESIUM: 2 mg/dL (ref 1.7–2.4)

## 2017-12-24 LAB — PHOSPHORUS: Phosphorus: 3.2 mg/dL (ref 2.5–4.6)

## 2017-12-24 LAB — GLUCOSE, CAPILLARY
GLUCOSE-CAPILLARY: 186 mg/dL — AB (ref 65–99)
GLUCOSE-CAPILLARY: 162 mg/dL — AB (ref 65–99)
Glucose-Capillary: 209 mg/dL — ABNORMAL HIGH (ref 65–99)

## 2017-12-24 MED ORDER — FAT EMULSION PLANT BASED 20 % IV EMUL
250.0000 mL | INTRAVENOUS | Status: AC
  Administered 2017-12-24: 250 mL via INTRAVENOUS
  Filled 2017-12-24: qty 250

## 2017-12-24 MED ORDER — BISACODYL 10 MG RE SUPP
10.0000 mg | Freq: Once | RECTAL | Status: AC
  Administered 2017-12-24: 10 mg via RECTAL
  Filled 2017-12-24: qty 1

## 2017-12-24 MED ORDER — TRACE MINERALS CR-CU-MN-SE-ZN 10-1000-500-60 MCG/ML IV SOLN
INTRAVENOUS | Status: AC
  Administered 2017-12-24: 17:00:00 via INTRAVENOUS
  Filled 2017-12-24: qty 1992

## 2017-12-24 NOTE — Progress Notes (Signed)
PHARMACY - ADULT TOTAL PARENTERAL NUTRITION CONSULT NOTE   Pharmacy Consult for TPN  Indication: Prolonged NPO, patient with post surgical ileus   Patient Measurements: Height: 5\' 9"  (175.3 cm) Weight: 212 lb 1.3 oz (96.2 kg) IBW/kg (Calculated) : 70.7 TPN AdjBW (KG): 77.4 Body mass index is 31.32 kg/m.   Assessment:   Pharmacy consulted for TPN management for 62 yo male s/p partial colectomy, now with postsurgical ileus.   TPN Access: PICC placed 4/27 TPN start date: 4/27  Current Nutrition: NPO  Plan:  Continue TPN regimen of Clinimix E 5/20 at 83 mL/hr + 20% ILE at 20 mL/hr over 12 hours. Provides 2233 kcal, 100 grams of protein, 2232 mL fluid daily including lipids. This is goal.  Provide adult MVI and trace elements as daily TPN additives.  Monitor daily weights with TPN.  Plan is to discontinue lactated ringers infusion.  Electrolytes WNL this AM. No further supplement required at this time.   SSI Q6hr.   Will obtain labs in am and as indicated.   Pharmacy will continue to monitor and adjust per consult.    Laural Benes, [place] 12/24/2017,9:22 AM

## 2017-12-24 NOTE — Progress Notes (Signed)
8 Days Post-Op  Subjective: Test post Hartman's procedure for perforated diverticulitis.  Patient feels rumbling and has passed gas and there is bag but no stool output yet.  He questions whether or not he needs the nasogastric tube placed but does not want to have it placed.  He has no nausea or vomiting at this time.  Objective: Vital signs in last 24 hours: Temp:  [98 F (36.7 C)-98.6 F (37 C)] 98 F (36.7 C) (04/30 0525) Pulse Rate:  [84-100] 100 (04/30 0525) Resp:  [18-20] 20 (04/30 0525) BP: (125-130)/(72-74) 125/74 (04/30 0525) SpO2:  [94 %-98 %] 94 % (04/30 0525) Weight:  [212 lb 1.3 oz (96.2 kg)-213 lb 13.5 oz (97 kg)] 212 lb 1.3 oz (96.2 kg) (04/30 0419) Last BM Date: 12/16/17  Intake/Output from previous day: 04/29 0701 - 04/30 0700 In: 4091 [P.O.:100; I.V.:3141; IV Piggyback:850] Out: 4315 [Urine:3525; Drains:60] Intake/Output this shift: No intake/output data recorded.  Physical exam:  Awake alert and oriented vital signs are stable and afebrile abdomen is distended and tympanitic but nontender wound is clean with granulating tissue drains are in place ostomy shows minimal stool at the ostomy but no stool in the bag.  Lab Results: CBC  Recent Labs    12/22/17 0544 12/23/17 0654  WBC 11.8* 12.9*  HGB 9.8* 12.6*  HCT 29.0* 35.8*  PLT 304 328   BMET Recent Labs    12/23/17 0654 12/24/17 0640  NA 137 134*  K 3.2* 3.8  CL 106 104  CO2 25 24  GLUCOSE 183* 204*  BUN 12 13  CREATININE 0.79 0.74  CALCIUM 7.4* 7.5*   PT/INR No results for input(s): LABPROT, INR in the last 72 hours. ABG No results for input(s): PHART, HCO3 in the last 72 hours.  Invalid input(s): PCO2, PO2  Studies/Results: Ct Abdomen Pelvis W Contrast  Result Date: 12/22/2017 CLINICAL DATA:  Patient status post partial colectomy for perforated sigmoid colonic diverticulitis. EXAM: CT ABDOMEN AND PELVIS WITH CONTRAST TECHNIQUE: Multidetector CT imaging of the abdomen and pelvis was  performed using the standard protocol following bolus administration of intravenous contrast. CONTRAST:  153mL ISOVUE-370 IOPAMIDOL (ISOVUE-370) INJECTION 76% COMPARISON:  CT abdomen pelvis 12/16/2017 FINDINGS: Lower chest: Normal heart size. Small bilateral pleural effusions. Subpleural consolidation lower lobes bilaterally, favored to represent atelectasis. Hepatobiliary: The liver is normal in size and contour. Subcentimeter too small to characterize low-attenuation lesions within the hepatic dome and left hepatic lobe re-demonstrated. Dense material demonstrated within the gallbladder lumen. No intrahepatic or extrahepatic biliary ductal dilatation. Pancreas: Mild atrophy of the pancreatic parenchyma. Spleen: Unremarkable Adrenals/Urinary Tract: Adrenal glands are normal. 4.2 cm simple cyst superior pole left kidney. 5.5 cm simple cyst superior pole right kidney. Additionally within the superior pole of the right kidney there is a 2.5 x 2.5 cm dense lesion (image 27; series 2) with an internal density of 56 Hounsfield units. Additional subcentimeter bilateral low-attenuation renal lesions are demonstrated, too small to characterize. Small amount of gas within the urinary bladder. Stomach/Bowel: Left lower quadrant colostomy. Small amount of fluid within the small bowel mesentery. Within the anterior central small bowel mesentery there is a 2.6 x 2.0 cm peripherally enhancing fluid collection (image 47; series 2). Within the right hemiabdomen small bowel mesentery there is a 2.4 x 1.7 cm peripherally enhancing fluid collection (image 53; series 2). Loculated fluid is demonstrated within the central small bowel mesentery measuring up to 2.9 x 1.9 cm (image 56; series 2). Enteric tube terminates in the  stomach. Oral contrast material throughout the small and large bowel. Mild wall thickening of the distal small bowel. Few tiny foci of free intraperitoneal air, likely postprocedural in etiology. Surgical drains are  demonstrated coursing through the lower mid abdomen. Vascular/Lymphatic: Normal caliber abdominal aorta. Peripheral calcified atherosclerotic plaque. No retroperitoneal lymphadenopathy. Reproductive: Prostate is unremarkable. Other: Bilateral fat containing inguinal hernias, right greater than left. Midline surgical wound. Musculoskeletal: Lumbar spine degenerative changes. No aggressive or acute appearing osseous lesions. IMPRESSION: 1. Postsurgical changes compatible with interval laparotomy and sigmoid colectomy. Left lower anterior abdominal colostomy. 2. There are at least 3 small fluid collections within the small bowel mesentery which may be postsurgical in etiology or represent postoperative abscess formation. Additionally, there is a small amount of fluid throughout the small bowel mesentery. Surgical drains course of the lower anterior abdomen. 3. Indeterminate renal lesion superior pole right kidney. After resolution of the acute symptomatology, recommend dedicated evaluation with pre and post contrast-enhanced MRI. 4. Moderate layering bilateral pleural effusions with underlying consolidation favored to represent atelectasis. Infection not excluded. 5. These results will be called to the ordering clinician or representative by the Radiologist Assistant, and communication documented in the PACS or zVision Dashboard. Electronically Signed   By: Lovey Newcomer M.D.   On: 12/22/2017 17:53    Anti-infectives: Anti-infectives (From admission, onward)   Start     Dose/Rate Route Frequency Ordered Stop   12/17/17 0400  metroNIDAZOLE (FLAGYL) IVPB 500 mg     500 mg 100 mL/hr over 60 Minutes Intravenous Every 8 hours 12/16/17 2148     12/17/17 0100  ciprofloxacin (CIPRO) IVPB 400 mg     400 mg 200 mL/hr over 60 Minutes Intravenous Every 12 hours 12/16/17 2148     12/16/17 2000  metroNIDAZOLE (FLAGYL) IVPB 500 mg  Status:  Discontinued     500 mg 100 mL/hr over 60 Minutes Intravenous  Once 12/16/17 1951  12/16/17 2253   12/16/17 1245  ciprofloxacin (CIPRO) IVPB 400 mg     400 mg 200 mL/hr over 60 Minutes Intravenous  Once 12/16/17 1230 12/16/17 1416   12/16/17 1245  metroNIDAZOLE (FLAGYL) IVPB 500 mg     500 mg 100 mL/hr over 60 Minutes Intravenous  Once 12/16/17 1230 12/16/17 1517      Assessment/Plan: s/p Procedure(s): EXPLORATORY LAPAROTOMY/ SIGMOID COLECTOMY, colostomy   Recommend continuing TPN at this point awaiting bowel function.  I would not replace the nasogastric tube unless was absolutely necessary due to vomiting but I anticipate his ileus to improve and bowel function returned shortly.  Florene Glen, MD, FACS  12/24/2017

## 2017-12-24 NOTE — Progress Notes (Signed)
Patient complain of tightness of his abd. Dr. Rosana Hoes notified and per him, Dr. Burt Knack will check on him during his rounding.

## 2017-12-24 NOTE — Progress Notes (Signed)
Nutrition Follow Up Note   DOCUMENTATION CODES:   Obesity unspecified  INTERVENTION:   Continue Clinimix E 5/20 at 83 mL/hr until pt is able to tolerate full liquid diet with supplements    Continue 20% ILE at 20 mL/hr over 12 hours.  Regimen provides 2233 kcal, 100 grams of protein, 2232 mL fluid daily including lipids.  Provide adult MVI and trace elements as daily TPN additives.  Monitor daily weights   NUTRITION DIAGNOSIS:   Increased nutrient needs related to post-op healing as evidenced by increased estimated needs from protein and energy.  GOAL:   Patient will meet greater than or equal to 90% of their needs  -met with TPN  MONITOR:   Diet advancement, Labs, Weight trends, Skin, I & O's  ASSESSMENT:   62 year old male with PMHx of asthma, diverticulitis who was admitted with acute left lower quadrant pain found to have perforated diverticulitis s/p exploratory laparotomy with sigmoid colectomy and creation of colostomy on 12/16/2017.   -Patient now with post-operative ileus.  Pt tolerating TPN well; blood sugars slightly elevated. No output from ostomy yet but pt reports having some flatus. Pt remains on clear liquid diet. Recommend continue TPN until pt is able to tolerate a full liquid diet with supplements. Per chart, pt is weight stable. Refeed labs stable.    Medications reviewed and include: Lovenox, Novolog 0-15 units Q6hrs, ciprofloxacin, famotidine, Flagyl  Labs reviewed: Na 134(L), K 3.8 wnl, Ca 7.5(L), P 3.2 wnl, Mg 2.0 wnl Prealbumin- 8.1(L)- 4/29 triglycerides 122 wnl- 4/29 cbgs- 178, 164, 151, 169, 209 x 24 hrs  Diet Order:  Clear liquid diet   EDUCATION NEEDS:   Education needs have been addressed  Skin:  Skin Assessment: Skin Integrity Issues: Skin Integrity Issues:: Incisions Incisions: closed incision to abdomen  Last BM:  no output from ostomy yet  Height:   Ht Readings from Last 1 Encounters:  12/16/17 _0  (1.753 m)     Weight:   Wt Readings from Last 1 Encounters:  12/24/17 212 lb 1.3 oz (96.2 kg)    Ideal Body Weight:  72.7 kg  BMI:  Body mass index is 31.32 kg/m.  Estimated Nutritional Needs:   Kcal:  2130-2310 (MSJ x 1.2-1.3)  Protein:  105-125 grams (1.1-1.3 grams/kg)  Fluid:  1.8-2.2 L/day (25-30 kcal/kg IBW)  Koleen Distance MS, RD, LDN Pager #- [phone] After Hours Pager: [phone]

## 2017-12-24 NOTE — Progress Notes (Signed)
Inpatient Diabetes Program Recommendations  AACE/ADA: New Consensus Statement on Inpatient Glycemic Control (2015)  Target Ranges:  Prepandial:   less than 140 mg/dL      Peak postprandial:   less than 180 mg/dL (1-2 hours)      Critically ill patients:  140 - 180 mg/dL   Lab Results  Component Value Date   GLUCAP 209 (H) 12/24/2017   HGBA1C 5.7 (H) 12/20/2017    Review of Glycemic ControlResults for Jonathon Lee, Jonathon Lee (MRN [id number]) as of 12/24/2017 11:19  Ref. Range 12/23/2017 06:23 12/23/2017 12:04 12/23/2017 16:36 12/23/2017 23:45 12/24/2017 05:22  Glucose-Capillary Latest Ref Range: 65 - 99 mg/dL 178 (H) 164 (H) 151 (H) 169 (H) 209 (H)    Note mild elevation of blood sugars with TPN. May consider increasing frequency of Novolog correction to q 4 hours.    Thanks,  Adah Perl, RN, BC-ADM Inpatient Diabetes Coordinator Pager [phone] (8a-5p)

## 2017-12-25 LAB — GLUCOSE, CAPILLARY
GLUCOSE-CAPILLARY: 183 mg/dL — AB (ref 65–99)
GLUCOSE-CAPILLARY: 218 mg/dL — AB (ref 65–99)
GLUCOSE-CAPILLARY: 237 mg/dL — AB (ref 65–99)
GLUCOSE-CAPILLARY: 255 mg/dL — AB (ref 65–99)
Glucose-Capillary: 221 mg/dL — ABNORMAL HIGH (ref 65–99)
Glucose-Capillary: 254 mg/dL — ABNORMAL HIGH (ref 65–99)

## 2017-12-25 LAB — CBC WITH DIFFERENTIAL/PLATELET
BASOS ABS: 0.2 10*3/uL — AB (ref 0–0.1)
BASOS PCT: 1 %
EOS ABS: 0.2 10*3/uL (ref 0–0.7)
Eosinophils Relative: 1 %
HCT: 36.1 % — ABNORMAL LOW (ref 40.0–52.0)
HEMOGLOBIN: 12 g/dL — AB (ref 13.0–18.0)
Lymphocytes Relative: 7 %
Lymphs Abs: 1.6 10*3/uL (ref 1.0–3.6)
MCH: 30.6 pg (ref 26.0–34.0)
MCHC: 33.4 g/dL (ref 32.0–36.0)
MCV: 91.6 fL (ref 80.0–100.0)
MONOS PCT: 3 %
Monocytes Absolute: 0.7 10*3/uL (ref 0.2–1.0)
NEUTROS PCT: 88 %
Neutro Abs: 19.6 10*3/uL — ABNORMAL HIGH (ref 1.4–6.5)
Platelets: 384 10*3/uL (ref 150–440)
RBC: 3.94 MIL/uL — ABNORMAL LOW (ref 4.40–5.90)
RDW: 14.6 % — ABNORMAL HIGH (ref 11.5–14.5)
WBC: 22.3 10*3/uL — AB (ref 3.8–10.6)

## 2017-12-25 MED ORDER — FAT EMULSION PLANT BASED 20 % IV EMUL
250.0000 mL | INTRAVENOUS | Status: AC
  Administered 2017-12-25: 250 mL via INTRAVENOUS
  Filled 2017-12-25: qty 250

## 2017-12-25 MED ORDER — TRACE MINERALS CR-CU-MN-SE-ZN 10-1000-500-60 MCG/ML IV SOLN
INTRAVENOUS | Status: AC
  Administered 2017-12-25: 18:00:00 via INTRAVENOUS
  Filled 2017-12-25: qty 1992

## 2017-12-25 MED ORDER — BISACODYL 10 MG RE SUPP
10.0000 mg | Freq: Once | RECTAL | Status: AC
  Administered 2017-12-25: 10 mg via RECTAL
  Filled 2017-12-25: qty 1

## 2017-12-25 MED ORDER — AMITRIPTYLINE HCL 50 MG PO TABS
50.0000 mg | ORAL_TABLET | Freq: Every day | ORAL | Status: DC
  Administered 2017-12-25 – 2017-12-30 (×6): 50 mg via ORAL
  Filled 2017-12-25 (×8): qty 1

## 2017-12-25 MED ORDER — INSULIN ASPART 100 UNIT/ML ~~LOC~~ SOLN
0.0000 [IU] | SUBCUTANEOUS | Status: DC
  Administered 2017-12-25: 5 [IU] via SUBCUTANEOUS
  Administered 2017-12-25: 8 [IU] via SUBCUTANEOUS
  Administered 2017-12-25 – 2017-12-26 (×3): 5 [IU] via SUBCUTANEOUS
  Administered 2017-12-26 (×4): 3 [IU] via SUBCUTANEOUS
  Administered 2017-12-26: 5 [IU] via SUBCUTANEOUS
  Administered 2017-12-27: 3 [IU] via SUBCUTANEOUS
  Administered 2017-12-27: 2 [IU] via SUBCUTANEOUS
  Administered 2017-12-27: 5 [IU] via SUBCUTANEOUS
  Administered 2017-12-27 (×2): 2 [IU] via SUBCUTANEOUS
  Administered 2017-12-28 (×2): 3 [IU] via SUBCUTANEOUS
  Administered 2017-12-28: 2 [IU] via SUBCUTANEOUS
  Administered 2017-12-28 (×2): 3 [IU] via SUBCUTANEOUS
  Administered 2017-12-28: 2 [IU] via SUBCUTANEOUS
  Administered 2017-12-29 (×2): 5 [IU] via SUBCUTANEOUS
  Administered 2017-12-29: 2 [IU] via SUBCUTANEOUS
  Administered 2017-12-29: 3 [IU] via SUBCUTANEOUS
  Administered 2017-12-29: 2 [IU] via SUBCUTANEOUS
  Administered 2017-12-29 – 2017-12-30 (×2): 3 [IU] via SUBCUTANEOUS
  Administered 2017-12-30: 2 [IU] via SUBCUTANEOUS
  Administered 2017-12-30: 3 [IU] via SUBCUTANEOUS
  Filled 2017-12-25 (×30): qty 1

## 2017-12-25 NOTE — Progress Notes (Signed)
Called by RN for patient with abdominal pain Patient states his been having abdominal pain for about 3 hours.  It came on right after he drank his clear liquid meal.  He describes it is somewhat cramping in nature and points to the right upper quadrant.  He is nauseated but has not vomited he still has no output from his ostomy.  Abdomen exam is essentially unchanged and remains distended but is essentially nontender there is certainly no peritoneal signs and I can push deeply on the abdominal wall and there is no tenderness.  The wound is clean and granulating.  Vital signs are reviewed and essentially stable with the exception of reported respiratory rate of 36 but when I took his respirations in the room it was approximately 22.  He is afebrile  CT scan done 3 days ago was reviewed showing small abscesses.  I suspect that this is related to his ileus and return of bowel function with some cramping abdominal pain.  There are no signs of peritoneal irritation to suggest an intra-abdominal catastrophe and otherwise other than his respiratory rate he appears quite stable.  I have ordered 2 mg of morphine and asked to place another Dulcolax suppository later this afternoon.

## 2017-12-25 NOTE — Progress Notes (Signed)
9 Days Post-Op  Subjective: This patient is 9 days status post Hartman's procedure for perforated diverticulitis.  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  He feels well and is very discouraged and wants to go home.  Is on TPN and tolerating clear liquids.  Objective: Vital signs in last 24 hours: Temp:  [97.8 F (36.6 C)-98.2 F (36.8 C)] 97.8 F (36.6 C) (05/01 0551) Pulse Rate:  [87-102] 87 (05/01 0551) Resp:  [20] 20 (05/01 0551) BP: (110-129)/(64-74) 110/64 (05/01 0551) SpO2:  [95 %-96 %] 95 % (05/01 0551) Weight:  [211 lb 8 oz (95.9 kg)] 211 lb 8 oz (95.9 kg) (05/01 0500) Last BM Date: 12/16/17  Intake/Output from previous day: 04/30 0701 - 05/01 0700 In: 3195.9 [I.V.:2193.9; IV Piggyback:1002] Out: 8315 [Urine:1600; Drains:20] Intake/Output this shift: Total I/O In: 1184 [I.V.:1184] Out: 100 [Urine:100]  Physical exam:  Vital signs stable and reviewed.  Abdomen is soft distended minimally tympanitic nontender wound is clean with granulation tissue drains are in place bilaterally ostomy is pink but no stool output yet.  I there was an audible flatus noted during the exam.  We will place a Dulcolax suppository again today in hopes of stimulating output.  Lab Results: CBC  Recent Labs    12/23/17 0654 12/25/17 0442  WBC 12.9* 22.3*  HGB 12.6* 12.0*  HCT 35.8* 36.1*  PLT 328 384   BMET Recent Labs    12/23/17 0654 12/24/17 0640  NA 137 134*  K 3.2* 3.8  CL 106 104  CO2 25 24  GLUCOSE 183* 204*  BUN 12 13  CREATININE 0.79 0.74  CALCIUM 7.4* 7.5*   PT/INR No results for input(s): LABPROT, INR in the last 72 hours. ABG No results for input(s): PHART, HCO3 in the last 72 hours.  Invalid input(s): PCO2, PO2  Studies/Results: No results found.  Anti-infectives: Anti-infectives (From admission, onward)   Start     Dose/Rate Route Frequency Ordered Stop   12/17/17 0400  metroNIDAZOLE (FLAGYL) IVPB 500 mg     500  mg 100 mL/hr over 60 Minutes Intravenous Every 8 hours 12/16/17 2148     12/17/17 0100  ciprofloxacin (CIPRO) IVPB 400 mg     400 mg 200 mL/hr over 60 Minutes Intravenous Every 12 hours 12/16/17 2148     12/16/17 2000  metroNIDAZOLE (FLAGYL) IVPB 500 mg  Status:  Discontinued     500 mg 100 mL/hr over 60 Minutes Intravenous  Once 12/16/17 1951 12/16/17 2253   12/16/17 1245  ciprofloxacin (CIPRO) IVPB 400 mg     400 mg 200 mL/hr over 60 Minutes Intravenous  Once 12/16/17 1230 12/16/17 1416   12/16/17 1245  metroNIDAZOLE (FLAGYL) IVPB 500 mg     500 mg 100 mL/hr over 60 Minutes Intravenous  Once 12/16/17 1230 12/16/17 1517      Assessment/Plan: s/p Procedure(s): EXPLORATORY LAPAROTOMY/ SIGMOID COLECTOMY, colostomy   Collect suppository as above.  Patient doing otherwise well and supported on TPN but his bowel function has not yet returned.  Florene Glen, MD, FACS  12/25/2017

## 2017-12-25 NOTE — Progress Notes (Signed)
PHARMACY - ADULT TOTAL PARENTERAL NUTRITION CONSULT NOTE   Pharmacy Consult for TPN  Indication: Prolonged NPO, patient with post surgical ileus   Patient Measurements: Height: 5\' 9"  (175.3 cm) Weight: 211 lb 8 oz (95.9 kg) IBW/kg (Calculated) : 70.7 TPN AdjBW (KG): 77.4 Body mass index is 31.23 kg/m.   Assessment:   Pharmacy consulted for TPN management for 62 yo male s/p partial colectomy, now with postsurgical ileus.   TPN Access: PICC placed 4/27 TPN start date: 4/27  Current Nutrition: NPO  Plan:  Continue TPN regimen of Clinimix E 5/20 at 83 mL/hr + 20% ILE at 20 mL/hr over 12 hours. Provides 2233 kcal, 100 grams of protein, 2232 mL fluid daily including lipids. This is goal.  Provide adult MVI and trace elements as daily TPN additives.  Monitor daily weights with TPN.  Plan is to discontinue lactated ringers infusion.  Electrolytes WNL this AM. No further supplement required at this time.   SSI Q4hr. Based on diabetes coordinator recommendation    Will obtain labs in am and as indicated.   Pharmacy will continue to monitor and adjust per consult.    Herron Fero, RPH 12/25/2017,10:49 AM

## 2017-12-26 ENCOUNTER — Inpatient Hospital Stay: Payer: BC Managed Care – PPO

## 2017-12-26 ENCOUNTER — Encounter: Payer: Self-pay | Admitting: Radiology

## 2017-12-26 LAB — COMPREHENSIVE METABOLIC PANEL
ALBUMIN: 2.3 g/dL — AB (ref 3.5–5.0)
ALT: 10 U/L — AB (ref 17–63)
AST: 15 U/L (ref 15–41)
Alkaline Phosphatase: 39 U/L (ref 38–126)
Anion gap: 4 — ABNORMAL LOW (ref 5–15)
BILIRUBIN TOTAL: 0.2 mg/dL — AB (ref 0.3–1.2)
BUN: 14 mg/dL (ref 6–20)
CHLORIDE: 101 mmol/L (ref 101–111)
CO2: 25 mmol/L (ref 22–32)
CREATININE: 0.58 mg/dL — AB (ref 0.61–1.24)
Calcium: 7.2 mg/dL — ABNORMAL LOW (ref 8.9–10.3)
GFR calc Af Amer: >60 mL/min (ref >60)
GFR calc non Af Amer: >60 mL/min (ref >60)
Glucose, Bld: 218 mg/dL — ABNORMAL HIGH (ref 65–99)
POTASSIUM: 3.6 mmol/L (ref 3.5–5.1)
Sodium: 130 mmol/L — ABNORMAL LOW (ref 135–145)
TOTAL PROTEIN: 5.2 g/dL — AB (ref 6.5–8.1)

## 2017-12-26 LAB — CBC WITH DIFFERENTIAL/PLATELET
Basophils Absolute: 0.1 10*3/uL (ref 0–0.1)
Basophils Relative: 1 %
Eosinophils Absolute: 0.1 10*3/uL (ref 0–0.7)
Eosinophils Relative: 1 %
HEMATOCRIT: 32.3 % — AB (ref 40.0–52.0)
HEMOGLOBIN: 10.8 g/dL — AB (ref 13.0–18.0)
LYMPHS ABS: 1.3 10*3/uL (ref 1.0–3.6)
Lymphocytes Relative: 7 %
MCH: 30.1 pg (ref 26.0–34.0)
MCHC: 33.3 g/dL (ref 32.0–36.0)
MCV: 90.4 fL (ref 80.0–100.0)
MONOS PCT: 5 %
Monocytes Absolute: 0.9 10*3/uL (ref 0.2–1.0)
NEUTROS ABS: 16.9 10*3/uL — AB (ref 1.4–6.5)
NEUTROS PCT: 88 %
Platelets: 331 10*3/uL (ref 150–440)
RBC: 3.57 MIL/uL — ABNORMAL LOW (ref 4.40–5.90)
RDW: 14.3 % (ref 11.5–14.5)
WBC: 19.3 10*3/uL — ABNORMAL HIGH (ref 3.8–10.6)

## 2017-12-26 LAB — GLUCOSE, CAPILLARY
GLUCOSE-CAPILLARY: 161 mg/dL — AB (ref 65–99)
GLUCOSE-CAPILLARY: 162 mg/dL — AB (ref 65–99)
GLUCOSE-CAPILLARY: 187 mg/dL — AB (ref 65–99)
Glucose-Capillary: 237 mg/dL — ABNORMAL HIGH (ref 65–99)
Glucose-Capillary: 216 mg/dL — ABNORMAL HIGH (ref 65–99)
Glucose-Capillary: 165 mg/dL — ABNORMAL HIGH (ref 65–99)

## 2017-12-26 LAB — MAGNESIUM: Magnesium: 1.9 mg/dL (ref 1.7–2.4)

## 2017-12-26 LAB — PHOSPHORUS: Phosphorus: 2.6 mg/dL (ref 2.5–4.6)

## 2017-12-26 MED ORDER — IOPAMIDOL (ISOVUE-300) INJECTION 61%: 15.0000 mL | INTRAVENOUS | Status: DC

## 2017-12-26 MED ORDER — FAT EMULSION PLANT BASED 20 % IV EMUL
250.0000 mL | INTRAVENOUS | Status: AC
  Administered 2017-12-26: 250 mL via INTRAVENOUS
  Filled 2017-12-26: qty 250

## 2017-12-26 MED ORDER — IOPAMIDOL (ISOVUE-300) INJECTION 61%
15.0000 mL | INTRAVENOUS | Status: AC
  Administered 2017-12-26 (×2): 15 mL via ORAL

## 2017-12-26 MED ORDER — TRACE MINERALS CR-CU-MN-SE-ZN 10-1000-500-60 MCG/ML IV SOLN
INTRAVENOUS | Status: AC
  Administered 2017-12-26: 19:00:00 via INTRAVENOUS
  Filled 2017-12-26: qty 1992

## 2017-12-26 MED ORDER — IOPAMIDOL (ISOVUE-370) INJECTION 76%
75.0000 mL | Freq: Once | INTRAVENOUS | Status: AC | PRN
  Administered 2017-12-26: 75 mL via INTRAVENOUS

## 2017-12-26 NOTE — Progress Notes (Signed)
PHARMACY - ADULT TOTAL PARENTERAL NUTRITION CONSULT NOTE   Pharmacy Consult for TPN  Indication: Prolonged NPO, patient with post surgical ileus   Patient Measurements: Height: 5\' 9"  (175.3 cm) Weight: 211 lb (95.7 kg) IBW/kg (Calculated) : 70.7 TPN AdjBW (KG): 77.4 Body mass index is 31.16 kg/m.   Assessment:   Pharmacy consulted for TPN management for 62 yo male s/p partial colectomy, now with postsurgical ileus.   TPN Access: PICC placed 4/27 TPN start date: 4/27  Current Nutrition: NPO  Plan:  Continue TPN regimen of Clinimix E 5/20 at 83 mL/hr + 20% ILE at 20 mL/hr over 12 hours. Provides 2233 kcal, 100 grams of protein, 2232 mL fluid daily including lipids. This is goal.  Provide adult MVI and trace elements as daily TPN additives.  Monitor daily weights with TPN.  Plan is to discontinue lactated ringers infusion.  Electrolytes WNL this AM. No further supplement required at this time.   SSI Q4hr.   Will obtain labs in am and as indicated.   Pharmacy will continue to monitor and adjust per consult.    Donna Christen Khalee Mazo, [facility] 12/26/2017,9:12 AM

## 2017-12-26 NOTE — Progress Notes (Signed)
10 Days Post-Op  Subjective: This patient with diverticulitis status post Hartman's procedure.  Yesterday he started having increased abdominal pain which continues today but is slightly improved.  His white blood cell count has elevated this morning.  Objective: Vital signs in last 24 hours: Temp:  [98.2 F (36.8 C)-98.9 F (37.2 C)] 98.4 F (36.9 C) (05/02 0358) Pulse Rate:  [78-88] 88 (05/02 0358) Resp:  [18-36] 18 (05/02 0358) BP: (128-135)/(71-78) 128/77 (05/02 0358) SpO2:  [96 %-99 %] 96 % (05/02 0358) Weight:  [211 lb (95.7 kg)] 211 lb (95.7 kg) (05/02 0259) Last BM Date: 12/16/17  Intake/Output from previous day: 05/01 0701 - 05/02 0700 In: 2588 [P.O.:240; I.V.:1998; IV Piggyback:350] Out: 1440 [Urine:1425; Drains:15] Intake/Output this shift: No intake/output data recorded.  Physical exam:  Vital signs are reviewed, afebrile No real change in exam.  Patient remains comfortable appearing with a distended abdomen wound is clean with granulation tissue no ostomy output serous fluid in both drains that are in the pelvis.  Abdomen is otherwise nontender.  Lab Results: CBC  Recent Labs    12/25/17 0442 12/26/17 0618  WBC 22.3* 19.3*  HGB 12.0* 10.8*  HCT 36.1* 32.3*  PLT 384 331   BMET Recent Labs    12/24/17 0640 12/26/17 0618  NA 134* 130*  K 3.8 3.6  CL 104 101  CO2 24 25  GLUCOSE 204* 218*  BUN 13 14  CREATININE 0.74 0.58*  CALCIUM 7.5* 7.2*   PT/INR No results for input(s): LABPROT, INR in the last 72 hours. ABG No results for input(s): PHART, HCO3 in the last 72 hours.  Invalid input(s): PCO2, PO2  Studies/Results: No results found.  Anti-infectives: Anti-infectives (From admission, onward)   Start     Dose/Rate Route Frequency Ordered Stop   12/17/17 0400  metroNIDAZOLE (FLAGYL) IVPB 500 mg     500 mg 100 mL/hr over 60 Minutes Intravenous Every 8 hours 12/16/17 2148     12/17/17 0100  ciprofloxacin (CIPRO) IVPB 400 mg     400 mg 200  mL/hr over 60 Minutes Intravenous Every 12 hours 12/16/17 2148     12/16/17 2000  metroNIDAZOLE (FLAGYL) IVPB 500 mg  Status:  Discontinued     500 mg 100 mL/hr over 60 Minutes Intravenous  Once 12/16/17 1951 12/16/17 2253   12/16/17 1245  ciprofloxacin (CIPRO) IVPB 400 mg     400 mg 200 mL/hr over 60 Minutes Intravenous  Once 12/16/17 1230 12/16/17 1416   12/16/17 1245  metroNIDAZOLE (FLAGYL) IVPB 500 mg     500 mg 100 mL/hr over 60 Minutes Intravenous  Once 12/16/17 1230 12/16/17 1517      Assessment/Plan: s/p Procedure(s): EXPLORATORY LAPAROTOMY/ SIGMOID COLECTOMY, colostomy   Elevated white blood cell count with increasing abdominal pain slightly improved this morning in a patient with known small abscesses following a Hartman's procedure.  We will get a new CT scan to reassess the small abscesses to explain his white blood cell count elevation as well as his increased pain.  Florene Glen, MD, FACS  12/26/2017

## 2017-12-27 LAB — CBC WITH DIFFERENTIAL/PLATELET
BASOS ABS: 0.1 10*3/uL (ref 0–0.1)
Basophils Relative: 0 %
Eosinophils Absolute: 0.2 10*3/uL (ref 0–0.7)
Eosinophils Relative: 1 %
HEMATOCRIT: 34.1 % — AB (ref 40.0–52.0)
Hemoglobin: 11.7 g/dL — ABNORMAL LOW (ref 13.0–18.0)
LYMPHS PCT: 8 %
Lymphs Abs: 1.3 10*3/uL (ref 1.0–3.6)
MCH: 31 pg (ref 26.0–34.0)
MCHC: 34.3 g/dL (ref 32.0–36.0)
MCV: 90.4 fL (ref 80.0–100.0)
MONO ABS: 0.8 10*3/uL (ref 0.2–1.0)
Monocytes Relative: 5 %
NEUTROS ABS: 13.3 10*3/uL — AB (ref 1.4–6.5)
Neutrophils Relative %: 86 %
Platelets: 346 10*3/uL (ref 150–440)
RBC: 3.77 MIL/uL — ABNORMAL LOW (ref 4.40–5.90)
RDW: 14.4 % (ref 11.5–14.5)
WBC: 15.6 10*3/uL — ABNORMAL HIGH (ref 3.8–10.6)

## 2017-12-27 LAB — GLUCOSE, CAPILLARY
GLUCOSE-CAPILLARY: 136 mg/dL — AB (ref 65–99)
GLUCOSE-CAPILLARY: 153 mg/dL — AB (ref 65–99)
GLUCOSE-CAPILLARY: 199 mg/dL — AB (ref 65–99)
Glucose-Capillary: 201 mg/dL — ABNORMAL HIGH (ref 65–99)
Glucose-Capillary: 148 mg/dL — ABNORMAL HIGH (ref 65–99)
Glucose-Capillary: 146 mg/dL — ABNORMAL HIGH (ref 65–99)

## 2017-12-27 LAB — BASIC METABOLIC PANEL
ANION GAP: 4 — AB (ref 5–15)
BUN: 13 mg/dL (ref 6–20)
CALCIUM: 7.3 mg/dL — AB (ref 8.9–10.3)
CO2: 24 mmol/L (ref 22–32)
Chloride: 102 mmol/L (ref 101–111)
Creatinine, Ser: 0.81 mg/dL (ref 0.61–1.24)
GFR calc Af Amer: >60 mL/min (ref >60)
GFR calc non Af Amer: >60 mL/min (ref >60)
GLUCOSE: 207 mg/dL — AB (ref 65–99)
POTASSIUM: 4 mmol/L (ref 3.5–5.1)
Sodium: 130 mmol/L — ABNORMAL LOW (ref 135–145)

## 2017-12-27 MED ORDER — TRACE MINERALS CR-CU-MN-SE-ZN 10-1000-500-60 MCG/ML IV SOLN
INTRAVENOUS | Status: AC
  Administered 2017-12-27: 19:00:00 via INTRAVENOUS
  Filled 2017-12-27: qty 1992

## 2017-12-27 MED ORDER — FAT EMULSION PLANT BASED 20 % IV EMUL
250.0000 mL | INTRAVENOUS | Status: AC
  Administered 2017-12-27: 250 mL via INTRAVENOUS
  Filled 2017-12-27: qty 250

## 2017-12-27 NOTE — Consult Note (Signed)
[facility] Nurse ostomy follow up Stoma type/location: LLQ colostomy Stomal assessment/size: 1 1/4" pink and moist  Not producing stool.  Has had suppositories with no benefit at this time.  Stomal sweat in pouch and suppository remnants Peristomal assessment: intact Treatment options for stomal/peristomal skin: barrier ring Output none Ostomy pouching: 2pc.  2 1/4" pouch with barrier ring.  No stool at this time. Patient minimally participative.  WIll continue to encourage ongoing education Education provided: pouch change.  He cut barrier and participated.  Can roll pouch closed.  Wife cannot be present due to her work schedule . Enrolled patient in [place] Start Discharge program: Yes WOc team will continue to follow and remain available to patient, medical and nursing teams.  Domenic Moras RN BSN [place] Pager [phone]

## 2017-12-27 NOTE — Progress Notes (Signed)
11 Days Post-Op  Subjective: Patient status post Hartman's procedure who has not had his bowel function returned yet via the ostomy.  Yesterday he was experiencing considerable pain and that pain is resolved at this point.  He has been medicated minimally for pain at this point.  Additionally his white blood cell count was elevated and a CT scan was performed which is been personally reviewed and it showed improvement in the small fluid collections possible abscesses with decrease in size of all 3.  Objective: Vital signs in last 24 hours: Temp:  [97.8 F (36.6 C)-98.8 F (37.1 C)] 98.2 F (36.8 C) (05/03 0353) Pulse Rate:  [85-99] 99 (05/03 0353) Resp:  [20] 20 (05/03 0353) BP: (112-132)/(64-75) 112/64 (05/03 0353) SpO2:  [96 %-97 %] 96 % (05/03 0353) Weight:  [209 lb 11.2 oz (95.1 kg)] 209 lb 11.2 oz (95.1 kg) (05/03 0348) Last BM Date: 12/16/17  Intake/Output from previous day: 05/02 0701 - 05/03 0700 In: 6541 [P.O.:120; I.V.:5771; IV Piggyback:650] Out: 2765 [Urine:2750; Drains:15] Intake/Output this shift: No intake/output data recorded.  Physical exam:  Abdomen is soft nondistended nontympanitic and nontender wound is granulating with no purulence drains are present in both lower quadrants with serous fluid only.  Viable ostomy is noted with no output at this time.  It is nontender  Lab Results: CBC  Recent Labs    12/26/17 0618 12/27/17 0525  WBC 19.3* 15.6*  HGB 10.8* 11.7*  HCT 32.3* 34.1*  PLT 331 346   BMET Recent Labs    12/26/17 0618 12/27/17 0525  NA 130* 130*  K 3.6 4.0  CL 101 102  CO2 25 24  GLUCOSE 218* 207*  BUN 14 13  CREATININE 0.58* 0.81  CALCIUM 7.2* 7.3*   PT/INR No results for input(s): LABPROT, INR in the last 72 hours. ABG No results for input(s): PHART, HCO3 in the last 72 hours.  Invalid input(s): PCO2, PO2  Studies/Results: Ct Abdomen Pelvis W Contrast  Result Date: 12/26/2017 CLINICAL DATA:  Increased abdominal pain and  leukocytosis 10 days status post Hartmann procedure for diverticulitis. EXAM: CT ABDOMEN AND PELVIS WITH CONTRAST TECHNIQUE: Multidetector CT imaging of the abdomen and pelvis was performed using the standard protocol following bolus administration of intravenous contrast. CONTRAST:  42mL ISOVUE-370 IOPAMIDOL (ISOVUE-370) INJECTION 76% COMPARISON:  CT abdomen pelvis dated December 22, 2017. FINDINGS: Lower chest: Small bilateral pleural effusions again noted, slightly decreased on the right when compared to prior study. Bibasilar atelectasis. Hepatobiliary: Unchanged subcentimeter low-density lesion in the right liver. No new focal liver abnormality. Unchanged dense material within the gallbladder. No gallbladder wall thickening or biliary dilatation. Pancreas: Mild fatty atrophy, unchanged. No ductal dilatation or surrounding inflammatory changes. Spleen: Normal in size without focal abnormality. Adrenals/Urinary Tract: The adrenal glands are unremarkable. Stable bilateral renal cysts. Unchanged 2.5 cm intermediate to high density lesion in the superior pole of the right kidney. No renal or ureteral calculi. No hydronephrosis. Small amount of air is noted within the bladder, unchanged. Stomach/Bowel: Unchanged left lower quadrant colostomy. Mild wall thickening of the distal small bowel is unchanged. No bowel obstruction. The stomach is within normal limits. Normal appendix. Vascular/Lymphatic: Aortic atherosclerosis. No enlarged abdominal or pelvic lymph nodes. Reproductive: Prostate is unremarkable. Other: Right greater than left bilateral fat containing inguinal hernias. Midline surgical wound is unchanged. Surgical drains within the lower abdomen are unchanged. Interval decrease in size of the rim enhancing fluid collections in the small bowel mesentery. The anterior central collection now measures  2.1 x 1.5 cm (series 2, image 52), previously 2.6 x 2.0 cm. The right sided collection now measures 1.3 x 1.7 cm  (series 2, image 57), previously 1.7 x 2.4 cm. Loculated fluid within the central mesentery has also decreased. No new fluid collection. No pneumoperitoneum. Musculoskeletal: No acute or significant osseous findings. IMPRESSION: 1. Postsurgical changes related to recent sigmoid colectomy and left lower quadrant colostomy with interval decrease in size of the fluid collections in the small bowel mesentery. No new fluid collection. 2. Stable indeterminate renal lesion in the right kidney. Non-emergent renal protocol MRI is recommended for further evaluation after resolution of acute symptomatology. 3. Small bilateral pleural effusions, slightly decreased on the right. 4.  Aortic atherosclerosis (ICD10-I70.0). Electronically Signed   By: Titus Dubin M.D.   On: 12/26/2017 11:35    Anti-infectives: Anti-infectives (From admission, onward)   Start     Dose/Rate Route Frequency Ordered Stop   12/17/17 0400  metroNIDAZOLE (FLAGYL) IVPB 500 mg     500 mg 100 mL/hr over 60 Minutes Intravenous Every 8 hours 12/16/17 2148     12/17/17 0100  ciprofloxacin (CIPRO) IVPB 400 mg     400 mg 200 mL/hr over 60 Minutes Intravenous Every 12 hours 12/16/17 2148     12/16/17 2000  metroNIDAZOLE (FLAGYL) IVPB 500 mg  Status:  Discontinued     500 mg 100 mL/hr over 60 Minutes Intravenous  Once 12/16/17 1951 12/16/17 2253   12/16/17 1245  ciprofloxacin (CIPRO) IVPB 400 mg     400 mg 200 mL/hr over 60 Minutes Intravenous  Once 12/16/17 1230 12/16/17 1416   12/16/17 1245  metroNIDAZOLE (FLAGYL) IVPB 500 mg     500 mg 100 mL/hr over 60 Minutes Intravenous  Once 12/16/17 1230 12/16/17 1517      Assessment/Plan: s/p Procedure(s): EXPLORATORY LAPAROTOMY/ SIGMOID COLECTOMY, colostomy   Improved white blood cell count which is still elevated however.  CT scans been personally reviewed and it shows oral contrast proceeding through the right colon and transverse colon right up to and nearly adjacent to the ostomy.  I  anticipate return of bowel function at any time with the signs of no bowel obstruction and the improvement in the small abscesses.  I would continue IV antibiotics at this time and continue TPN.  This is discussed with the patient.  Florene Glen, MD, FACS  12/27/2017

## 2017-12-27 NOTE — Progress Notes (Signed)
PHARMACY - ADULT TOTAL PARENTERAL NUTRITION CONSULT NOTE   Pharmacy Consult for TPN  Indication: Prolonged NPO, patient with post surgical ileus   Patient Measurements: Height: 5\' 9"  (175.3 cm) Weight: 209 lb 11.2 oz (95.1 kg) IBW/kg (Calculated) : 70.7 TPN AdjBW (KG): 77.4 Body mass index is 30.97 kg/m.   Assessment:   Pharmacy consulted for TPN management for 62 yo male s/p partial colectomy, now with postsurgical ileus.   TPN Access: PICC placed 4/27 TPN start date: 4/27  Current Nutrition: NPO  Plan:  Continue TPN regimen of Clinimix E 5/20 at 83 mL/hr + 20% ILE at 20 mL/hr over 12 hours. Provides 2233 kcal, 100 grams of protein, 2232 mL fluid daily including lipids. This is goal.  Provide adult MVI and trace elements as daily TPN additives.  Monitor daily weights with TPN.  Plan is to discontinue lactated ringers infusion.  Electrolytes WNL this AM. No further supplement required at this time.   SSI Q4hr.   Will obtain labs in am and as indicated.   Pharmacy will continue to monitor and adjust per consult.    Donna Christen Ahlaya Ende, RPH 12/27/2017,9:29 AM

## 2017-12-27 NOTE — Care Management (Signed)
MD declines to pursue LTACH.  If needed patient will have home health at discharge

## 2017-12-28 LAB — GLUCOSE, CAPILLARY
GLUCOSE-CAPILLARY: 176 mg/dL — AB (ref 65–99)
GLUCOSE-CAPILLARY: 150 mg/dL — AB (ref 65–99)
GLUCOSE-CAPILLARY: 138 mg/dL — AB (ref 65–99)
GLUCOSE-CAPILLARY: 168 mg/dL — AB (ref 65–99)
Glucose-Capillary: 173 mg/dL — ABNORMAL HIGH (ref 65–99)

## 2017-12-28 LAB — BASIC METABOLIC PANEL
ANION GAP: 8 (ref 5–15)
BUN: 15 mg/dL (ref 6–20)
CHLORIDE: 102 mmol/L (ref 101–111)
CO2: 23 mmol/L (ref 22–32)
Calcium: 7.5 mg/dL — ABNORMAL LOW (ref 8.9–10.3)
Creatinine, Ser: 0.57 mg/dL — ABNORMAL LOW (ref 0.61–1.24)
GFR calc Af Amer: >60 mL/min (ref >60)
GFR calc non Af Amer: >60 mL/min (ref >60)
GLUCOSE: 192 mg/dL — AB (ref 65–99)
POTASSIUM: 3.6 mmol/L (ref 3.5–5.1)
Sodium: 133 mmol/L — ABNORMAL LOW (ref 135–145)

## 2017-12-28 MED ORDER — TRACE MINERALS CR-CU-MN-SE-ZN 10-1000-500-60 MCG/ML IV SOLN
INTRAVENOUS | Status: AC
  Administered 2017-12-28: 19:00:00 via INTRAVENOUS
  Filled 2017-12-28: qty 1992

## 2017-12-28 MED ORDER — FAT EMULSION PLANT BASED 20 % IV EMUL
250.0000 mL | INTRAVENOUS | Status: AC
  Administered 2017-12-28: 250 mL via INTRAVENOUS
  Filled 2017-12-28: qty 250

## 2017-12-28 NOTE — Progress Notes (Signed)
Digitized the ostomy.  No sign of ischemia no output and no stool on the examining digit.  Patient reports passing gas.  We will advance diet to full liquids today.

## 2017-12-28 NOTE — Progress Notes (Signed)
12 Days Post-Op  Subjective: Patient states he has almost no pain this morning although there is a documented 8 out of 10 in his nursing notes.  He states he has almost no pain this morning and no cramping.  No nausea vomiting no output from his ostomy yet  Objective: Vital signs in last 24 hours: Temp:  [98.4 F (36.9 C)-98.7 F (37.1 C)] 98.6 F (37 C) (05/04 0421) Pulse Rate:  [92-101] 95 (05/04 0421) Resp:  [18-20] 18 (05/04 0421) BP: (103-114)/(65-71) 103/68 (05/04 0421) SpO2:  [96 %-100 %] 96 % (05/04 0421) Weight:  [207 lb 4.8 oz (94 kg)] 207 lb 4.8 oz (94 kg) (05/04 0421) Last BM Date: 12/16/17  Intake/Output from previous day: 05/03 0701 - 05/04 0700 In: 2641 [P.O.:360; I.V.:2281] Out: 1875 [Urine:1875] Intake/Output this shift: No intake/output data recorded.  Physical exam:  Vital signs are stable and reviewed.  Abdomen is soft nondistended nontympanitic and nontender wound is clean JPs are in place with serous and slightly cloudy fluid.  No ostomy output ostomy is pink  Lab Results: CBC  Recent Labs    12/26/17 0618 12/27/17 0525  WBC 19.3* 15.6*  HGB 10.8* 11.7*  HCT 32.3* 34.1*  PLT 331 346   BMET Recent Labs    12/27/17 0525 12/28/17 0505  NA 130* 133*  K 4.0 3.6  CL 102 102  CO2 24 23  GLUCOSE 207* 192*  BUN 13 15  CREATININE 0.81 0.57*  CALCIUM 7.3* 7.5*   PT/INR No results for input(s): LABPROT, INR in the last 72 hours. ABG No results for input(s): PHART, HCO3 in the last 72 hours.  Invalid input(s): PCO2, PO2  Studies/Results: Ct Abdomen Pelvis W Contrast  Result Date: 12/26/2017 CLINICAL DATA:  Increased abdominal pain and leukocytosis 10 days status post Hartmann procedure for diverticulitis. EXAM: CT ABDOMEN AND PELVIS WITH CONTRAST TECHNIQUE: Multidetector CT imaging of the abdomen and pelvis was performed using the standard protocol following bolus administration of intravenous contrast. CONTRAST:  70mL ISOVUE-370 IOPAMIDOL  (ISOVUE-370) INJECTION 76% COMPARISON:  CT abdomen pelvis dated December 22, 2017. FINDINGS: Lower chest: Small bilateral pleural effusions again noted, slightly decreased on the right when compared to prior study. Bibasilar atelectasis. Hepatobiliary: Unchanged subcentimeter low-density lesion in the right liver. No new focal liver abnormality. Unchanged dense material within the gallbladder. No gallbladder wall thickening or biliary dilatation. Pancreas: Mild fatty atrophy, unchanged. No ductal dilatation or surrounding inflammatory changes. Spleen: Normal in size without focal abnormality. Adrenals/Urinary Tract: The adrenal glands are unremarkable. Stable bilateral renal cysts. Unchanged 2.5 cm intermediate to high density lesion in the superior pole of the right kidney. No renal or ureteral calculi. No hydronephrosis. Small amount of air is noted within the bladder, unchanged. Stomach/Bowel: Unchanged left lower quadrant colostomy. Mild wall thickening of the distal small bowel is unchanged. No bowel obstruction. The stomach is within normal limits. Normal appendix. Vascular/Lymphatic: Aortic atherosclerosis. No enlarged abdominal or pelvic lymph nodes. Reproductive: Prostate is unremarkable. Other: Right greater than left bilateral fat containing inguinal hernias. Midline surgical wound is unchanged. Surgical drains within the lower abdomen are unchanged. Interval decrease in size of the rim enhancing fluid collections in the small bowel mesentery. The anterior central collection now measures 2.1 x 1.5 cm (series 2, image 52), previously 2.6 x 2.0 cm. The right sided collection now measures 1.3 x 1.7 cm (series 2, image 57), previously 1.7 x 2.4 cm. Loculated fluid within the central mesentery has also decreased. No new fluid  collection. No pneumoperitoneum. Musculoskeletal: No acute or significant osseous findings. IMPRESSION: 1. Postsurgical changes related to recent sigmoid colectomy and left lower quadrant  colostomy with interval decrease in size of the fluid collections in the small bowel mesentery. No new fluid collection. 2. Stable indeterminate renal lesion in the right kidney. Non-emergent renal protocol MRI is recommended for further evaluation after resolution of acute symptomatology. 3. Small bilateral pleural effusions, slightly decreased on the right. 4.  Aortic atherosclerosis (ICD10-I70.0). Electronically Signed   By: Titus Dubin M.D.   On: 12/26/2017 11:35    Anti-infectives: Anti-infectives (From admission, onward)   Start     Dose/Rate Route Frequency Ordered Stop   12/17/17 0400  metroNIDAZOLE (FLAGYL) IVPB 500 mg     500 mg 100 mL/hr over 60 Minutes Intravenous Every 8 hours 12/16/17 2148     12/17/17 0100  ciprofloxacin (CIPRO) IVPB 400 mg     400 mg 200 mL/hr over 60 Minutes Intravenous Every 12 hours 12/16/17 2148     12/16/17 2000  metroNIDAZOLE (FLAGYL) IVPB 500 mg  Status:  Discontinued     500 mg 100 mL/hr over 60 Minutes Intravenous  Once 12/16/17 1951 12/16/17 2253   12/16/17 1245  ciprofloxacin (CIPRO) IVPB 400 mg     400 mg 200 mL/hr over 60 Minutes Intravenous  Once 12/16/17 1230 12/16/17 1416   12/16/17 1245  metroNIDAZOLE (FLAGYL) IVPB 500 mg     500 mg 100 mL/hr over 60 Minutes Intravenous  Once 12/16/17 1230 12/16/17 1517      Assessment/Plan: s/p Procedure(s): EXPLORATORY LAPAROTOMY/ SIGMOID COLECTOMY, colostomy   Will come back and digitalize the ostomy again today.  Still awaiting bowel function.  On CT scan the contrast extended to within only a few inches of the colostomy and it is unclear to me as to why it is not functional yet.  Patient is becoming frustrated.  Florene Glen, MD, FACS  12/28/2017

## 2017-12-28 NOTE — Progress Notes (Signed)
PHARMACY - ADULT TOTAL PARENTERAL NUTRITION CONSULT NOTE   Pharmacy Consult for TPN  Indication: Prolonged NPO, patient with post surgical ileus   Patient Measurements: Height: 5\' 9"  (175.3 cm) Weight: 207 lb 4.8 oz (94 kg) IBW/kg (Calculated) : 70.7 TPN AdjBW (KG): 77.4 Body mass index is 30.61 kg/m.   Assessment:   Pharmacy consulted for TPN management for 62 yo male s/p partial colectomy, now with postsurgical ileus.   TPN Access: PICC placed 4/27 TPN start date: 4/27  Current Nutrition: NPO  Plan:  Continue TPN regimen of Clinimix E 5/20 at 83 mL/hr + 20% ILE at 20 mL/hr over 12 hours. Provides 2233 kcal, 100 grams of protein, 2232 mL fluid daily including lipids. This is goal. (per dietary note continue until able to tolerate full liquids)  Provide adult MVI and trace elements as daily TPN additives.  Monitor daily weights with TPN.  Electrolytes WNL this AM. No further supplement required at this time.   SSI Q4hr. SSI per 24 hrs: 17 units  Per Surgery note: will increase to full liquids today 5/4.  Labs on Mon/Thur.  Pharmacy will continue to monitor and adjust per consult.    Cimberly Stoffel A, RPH 12/28/2017,10:15 AM

## 2017-12-28 NOTE — Plan of Care (Signed)
Pt ambulated 4 laps around the nurses station without difficulty

## 2017-12-29 LAB — CBC WITH DIFFERENTIAL/PLATELET
Basophils Absolute: 0.1 10*3/uL (ref 0–0.1)
Basophils Relative: 1 %
Eosinophils Absolute: 0.2 10*3/uL (ref 0–0.7)
Eosinophils Relative: 2 %
HEMATOCRIT: 33.7 % — AB (ref 40.0–52.0)
Hemoglobin: 11.5 g/dL — ABNORMAL LOW (ref 13.0–18.0)
LYMPHS ABS: 1.5 10*3/uL (ref 1.0–3.6)
LYMPHS PCT: 10 %
MCH: 30.9 pg (ref 26.0–34.0)
MCHC: 34.1 g/dL (ref 32.0–36.0)
MCV: 90.4 fL (ref 80.0–100.0)
MONOS PCT: 6 %
Monocytes Absolute: 0.9 10*3/uL (ref 0.2–1.0)
NEUTROS ABS: 12.7 10*3/uL — AB (ref 1.4–6.5)
NEUTROS PCT: 83 %
Platelets: 396 10*3/uL (ref 150–440)
RBC: 3.72 MIL/uL — ABNORMAL LOW (ref 4.40–5.90)
RDW: 14.3 % (ref 11.5–14.5)
WBC: 15.3 10*3/uL — ABNORMAL HIGH (ref 3.8–10.6)

## 2017-12-29 LAB — GLUCOSE, CAPILLARY
GLUCOSE-CAPILLARY: 174 mg/dL — AB (ref 65–99)
GLUCOSE-CAPILLARY: 139 mg/dL — AB (ref 65–99)
GLUCOSE-CAPILLARY: 134 mg/dL — AB (ref 65–99)
Glucose-Capillary: 190 mg/dL — ABNORMAL HIGH (ref 65–99)
Glucose-Capillary: 202 mg/dL — ABNORMAL HIGH (ref 65–99)
Glucose-Capillary: 232 mg/dL — ABNORMAL HIGH (ref 65–99)

## 2017-12-29 MED ORDER — TRACE MINERALS CR-CU-MN-SE-ZN 10-1000-500-60 MCG/ML IV SOLN
INTRAVENOUS | Status: DC
  Administered 2017-12-29: 18:00:00 via INTRAVENOUS
  Filled 2017-12-29: qty 960

## 2017-12-29 MED ORDER — FAT EMULSION PLANT BASED 20 % IV EMUL
250.0000 mL | INTRAVENOUS | Status: DC
  Administered 2017-12-29: 250 mL via INTRAVENOUS
  Filled 2017-12-29: qty 250

## 2017-12-29 MED ORDER — ENSURE ENLIVE PO LIQD
237.0000 mL | Freq: Three times a day (TID) | ORAL | Status: DC
  Administered 2017-12-29 – 2017-12-31 (×6): 237 mL via ORAL

## 2017-12-29 NOTE — Progress Notes (Signed)
Nutrition Follow-up  DOCUMENTATION CODES:   Obesity unspecified  INTERVENTION:  Current bag of Clinimix has been reduced to 50 mL/hr.   When new bag is hung tonight at 1800, recommend providing Clinimix E 5/20 at 40 mL/hr (to not waste a second liter of Clinimix) and also continue 20% ILE at 20 mL/hr over 12 hours.  Provide Ensure Enlive po TID, each supplement provides 350 kcal and 20 grams of protein. Patient prefers chocolate. Once patient is eating adequately, should be sufficient to drink BID. Discussed recommendation to continue drinking a high-calorie, high-protein oral nutrition supplement (Ensure Enlive, Ensure Plus, Boost Plus) BID for 4-6 weeks to promote adequate healing and maintain lean body mass.  Reviewed "Colostomy Nutrition Therapy" handout from the Academy of Nutrition and Dietetics with patient. Discussed recommendation to follow a low-fiber diet (<13 grams daily) initially to allow the bowel to heal and to help avoid blockage of the colostomy. Encouraged patient to chew food well and to keep a regular schedule for meals and snacks to help prevent blockage, reduce gas, and provide better absorption of nutrients from foods. Discussed eating the largest meal in the middle of the day instead of at night to help decrease overnight stool output so patient can have a better night's rest. Discouraged acidic, spicy, fried/greasy foods, and foods that are high in sugar as they can cause diarrhea. Reviewed foods that may cause blockage, gas/odor, and foods that may discolor stool. Reviewed foods that can help thicken stool if patient does experience diarrhea. Reviewed strategies to reduce gas. Encouraged adequate hydration (at least 8-10 cups of fluid per day) and reviewed signs of fluid-electrolyte imbalance. Discussed that when it is time to add in fiber-containing foods to do so slowly (one new food every 3 days) and to keep a food journal of new foods tried and symptoms. Encouraged a  chewable multivitamin with minerals daily and chewable or liquid calcium supplement daily to help prevent micronutrient deficiencies.  NUTRITION DIAGNOSIS:   Increased nutrient needs related to post-op healing as evidenced by estimated needs.  Ongoing.  GOAL:   Patient will meet greater than or equal to 90% of their needs  Met with TPN and diet advancement. Also addressing with addition of Ensure Enlive today.  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   -Diet advanced to clear liquids on 4/28, then full liquids on 5/4, and soft today (5/5).  Met with patient at bedside. He reports he has been tolerating his full liquids yesterday and tolerated his soft breakfast this morning. He denies any N/V or abdominal pain. He reports he is having colostomy output now. He has also been ambulating around the unit. Rate of Clinimix has been reduced to 50 mL/hr.  IV Access: right basilic triple lumen PICC placed 12/21/2017; ECG used to determine PICC terminates at [facility]  TPN: pt has been receiving Clinimix E 5/20 at 83 mL/hr + 20% ILE at 20 mL/hr over 12 hours (2233 kcal, 100 grams of protein, 2232 mL fluid daily including lipids) with adult MVI and trace elements as daily TPN additives  On 5/4 patient had 50% of his soup and sweet tea, and 100% of his ice cream and yogurt  for lunch. For dinner on 5/4 he had 50% of his sweet tea, 75% of his grits, and 100% of his ice cream and yogurt. For breakfast today (5/5) he had 1 pancake with syrup, 25% of his biscuit, 100% of his bacon and orange juice. In the past 24 hours he has had approximately 1195 kcal (56% minimum estimated kcal needs)  and 27.5 grams of protein (26% minimum estimated protein needs).  Medications reviewed and include: Novolog 0-15 units Q4hrs (received 20 units in past 24 hrs), Cipro, famotidine, Flagyl.  Labs reviewed: CBG 138-232 past 24 hrs. On 5/4 Sodium 133, Creatinine 0.57. Last Triglyceride level was 122 on 4/29. Potassium, Magnesium, and Phosphorus have been WNL on most recent checks (potassium was low prior to 4/30).  I/O: 3350 mL UOP yesterday (1.5 mL/kg/hr); no documented colostomy output yet, but per chart and patient report there is stool present in bag (RD did not assess)  Weight trend: 94.1 kg on 5/5; -2.9 kg since 4/28  Discussed with Surgeon today. TPN rate was decreased to 50 mL/hr. Plan is to remain at half rate overnight and if patient is tolerating diet may be able to discontinue TPN tomorrow. Okay to order [employer].  Diet Order:   Diet Order           DIET SOFT Room service appropriate? Yes; Fluid consistency: Thin  Diet effective now          EDUCATION NEEDS:   Education needs have been addressed  Skin:  Skin Assessment: Skin Integrity Issues: Skin Integrity Issues:: Incisions, Other (Comment) Incisions: closed incision to abdomen Other: ecchymosis to bilateral arms  Last BM:  pt reports colostomy output today; not yet documented in I/O  Height:   Ht Readings from Last 1 Encounters:  12/16/17 '5\' 9"'$  (1.753 m)    Weight:   Wt Readings from Last 1 Encounters:  12/29/17 207 lb 7.3 oz (94.1 kg)    Ideal Body Weight:  72.7 kg  BMI:  Body mass index is 30.64 kg/m.  Estimated Nutritional Needs:   Kcal:  2130-2310 (MSJ x 1.2-1.3)  Protein:  105-125 grams (1.1-1.3 grams/kg)  Fluid:  1.8-2.2 L/day (25-30 kcal/kg IBW)  Willey Blade, MS, RD, LDN Office: [phone] Pager: [phone] After Hours/Weekend Pager: [phone]

## 2017-12-29 NOTE — Progress Notes (Signed)
13 Days Post-Op  Subjective: Patient is essentially 2 weeks status post Hartman's procedure with a prolonged ileus.  He has started putting stool out of his bag finally today.  He has no complaints and no pain Objective: Vital signs in last 24 hours: Temp:  [98.5 F (36.9 C)-99.3 F (37.4 C)] 98.5 F (36.9 C) (05/05 0420) Pulse Rate:  [92-101] 95 (05/05 0420) Resp:  [18-20] 20 (05/05 0420) BP: (114-118)/(66-70) 115/66 (05/05 0420) SpO2:  [96 %-98 %] 96 % (05/05 0420) Weight:  [207 lb 7.3 oz (94.1 kg)] 207 lb 7.3 oz (94.1 kg) (05/05 0420) Last BM Date: 12/16/17  Intake/Output from previous day: 05/04 0701 - 05/05 0700 In: 3259.2 [P.O.:180; I.V.:2479.2; IV Piggyback:600] Out: 3350 [Urine:3350] Intake/Output this shift: No intake/output data recorded.  Physical exam:  Vital signs reviewed low-grade temp abdomen is soft nondistended wound is granulating well drains are in place and there is stool in the ostomy bag  Lab Results: CBC  Recent Labs    12/27/17 0525 12/29/17 0457  WBC 15.6* 15.3*  HGB 11.7* 11.5*  HCT 34.1* 33.7*  PLT 346 396   BMET Recent Labs    12/27/17 0525 12/28/17 0505  NA 130* 133*  K 4.0 3.6  CL 102 102  CO2 24 23  GLUCOSE 207* 192*  BUN 13 15  CREATININE 0.81 0.57*  CALCIUM 7.3* 7.5*   PT/INR No results for input(s): LABPROT, INR in the last 72 hours. ABG No results for input(s): PHART, HCO3 in the last 72 hours.  Invalid input(s): PCO2, PO2  Studies/Results: No results found.  Anti-infectives: Anti-infectives (From admission, onward)   Start     Dose/Rate Route Frequency Ordered Stop   12/17/17 0400  metroNIDAZOLE (FLAGYL) IVPB 500 mg     500 mg 100 mL/hr over 60 Minutes Intravenous Every 8 hours 12/16/17 2148     12/17/17 0100  ciprofloxacin (CIPRO) IVPB 400 mg     400 mg 200 mL/hr over 60 Minutes Intravenous Every 12 hours 12/16/17 2148     12/16/17 2000  metroNIDAZOLE (FLAGYL) IVPB 500 mg  Status:  Discontinued     500  mg 100 mL/hr over 60 Minutes Intravenous  Once 12/16/17 1951 12/16/17 2253   12/16/17 1245  ciprofloxacin (CIPRO) IVPB 400 mg     400 mg 200 mL/hr over 60 Minutes Intravenous  Once 12/16/17 1230 12/16/17 1416   12/16/17 1245  metroNIDAZOLE (FLAGYL) IVPB 500 mg     500 mg 100 mL/hr over 60 Minutes Intravenous  Once 12/16/17 1230 12/16/17 1517      Assessment/Plan: s/p Procedure(s): EXPLORATORY LAPAROTOMY/ SIGMOID COLECTOMY, colostomy   White blood cell count remains slightly elevated.  Stool present in bag for the first time in 2 weeks.  Will advance diet today.  Florene Glen, MD, FACS  12/29/2017

## 2017-12-29 NOTE — Progress Notes (Signed)
PHARMACY - ADULT TOTAL PARENTERAL NUTRITION CONSULT NOTE   Pharmacy Consult for TPN  Indication: Prolonged NPO, patient with post surgical ileus   Patient Measurements: Height: 5\' 9"  (175.3 cm) Weight: 207 lb 7.3 oz (94.1 kg) IBW/kg (Calculated) : 70.7 TPN AdjBW (KG): 77.4 Body mass index is 30.64 kg/m.   Assessment:   Pharmacy consulted for TPN management for 62 yo male s/p partial colectomy, now with postsurgical ileus.   TPN Access: PICC placed 4/27 TPN start date: 4/27  Current Nutrition: NPO  Plan:  Taper rate of TPN regimen of Clinimix E 5/20 to 40 mL/hr + 20% ILE at 20 mL/hr over 12 hours. Provides 2233 kcal, 100 grams of protein, 2232 mL fluid daily including lipids.  (per dietary note continue until able to tolerate full liquids)  Provide adult MVI and trace elements as daily TPN additives.  Monitor daily weights with TPN.  Electrolytes in am  SSI Q4hr. SSI per 24 hrs: 18 units  Per Surgery note: will advance diet. Soft diet ordered 5/5  Labs on Mon/Thur.  Pharmacy will continue to monitor and adjust per consult.    Nathalia Wismer A, RPH 12/29/2017,12:45 PM

## 2017-12-29 NOTE — Discharge Instructions (Signed)
[place] Hospital Stay Proper nutrition can help your body recover from illness and injury.   Foods and beverages high in protein, vitamins, and minerals help rebuild muscle loss, promote healing, & reduce fall risk.   In addition to eating healthy foods, a nutrition shake is an easy, delicious way to get the nutrition you need during and after your hospital stay  It is recommended that you continue to drink 2 bottles per day of:       Ensure Enlive for at least 4-6 weeks after your hospital stay   Tips for adding a nutrition shake into your routine: As allowed, drink one with vitamins or medications instead of water or juice Enjoy one as a tasty mid-morning or afternoon snack Drink cold or make a milkshake out of it Drink one instead of milk with cereal or snacks Use as a coffee creamer   Available at the following grocery stores and pharmacies:           * [place] [phone]            For COUPONS visit: www.ensure.com/join or [email or web address]-[email or web address]   Suggested Substitutions Ensure Plus = Boost Plus = Carnation Breakfast Essentials = Boost Compact Ensure Active Clear = Boost Breeze Glucerna Shake = Boost Glucose Control = Carnation Breakfast Essentials SUGAR FREE

## 2017-12-30 LAB — CBC WITH DIFFERENTIAL/PLATELET
BASOS ABS: 0.1 10*3/uL (ref 0–0.1)
BASOS PCT: 0 %
EOS PCT: 2 %
Eosinophils Absolute: 0.2 10*3/uL (ref 0–0.7)
HEMATOCRIT: 33.8 % — AB (ref 40.0–52.0)
Hemoglobin: 11.5 g/dL — ABNORMAL LOW (ref 13.0–18.0)
Lymphocytes Relative: 10 %
Lymphs Abs: 1.4 10*3/uL (ref 1.0–3.6)
MCH: 30.7 pg (ref 26.0–34.0)
MCHC: 34.1 g/dL (ref 32.0–36.0)
MCV: 90.2 fL (ref 80.0–100.0)
MONO ABS: 0.8 10*3/uL (ref 0.2–1.0)
Monocytes Relative: 6 %
NEUTROS ABS: 11.2 10*3/uL — AB (ref 1.4–6.5)
Neutrophils Relative %: 82 %
PLATELETS: 442 10*3/uL — AB (ref 150–440)
RBC: 3.75 MIL/uL — ABNORMAL LOW (ref 4.40–5.90)
RDW: 14.5 % (ref 11.5–14.5)
WBC: 13.7 10*3/uL — ABNORMAL HIGH (ref 3.8–10.6)

## 2017-12-30 LAB — GLUCOSE, CAPILLARY
GLUCOSE-CAPILLARY: 147 mg/dL — AB (ref 65–99)
Glucose-Capillary: 120 mg/dL — ABNORMAL HIGH (ref 65–99)
Glucose-Capillary: 153 mg/dL — ABNORMAL HIGH (ref 65–99)
Glucose-Capillary: 162 mg/dL — ABNORMAL HIGH (ref 65–99)

## 2017-12-30 LAB — COMPREHENSIVE METABOLIC PANEL
ALBUMIN: 2.5 g/dL — AB (ref 3.5–5.0)
ALT: 20 U/L (ref 17–63)
ANION GAP: 9 (ref 5–15)
AST: 23 U/L (ref 15–41)
Alkaline Phosphatase: 52 U/L (ref 38–126)
BILIRUBIN TOTAL: 0.3 mg/dL (ref 0.3–1.2)
BUN: 17 mg/dL (ref 6–20)
CO2: 24 mmol/L (ref 22–32)
Calcium: 7.7 mg/dL — ABNORMAL LOW (ref 8.9–10.3)
Chloride: 101 mmol/L (ref 101–111)
Creatinine, Ser: 0.86 mg/dL (ref 0.61–1.24)
GLUCOSE: 173 mg/dL — AB (ref 65–99)
Potassium: 3.9 mmol/L (ref 3.5–5.1)
Sodium: 134 mmol/L — ABNORMAL LOW (ref 135–145)
TOTAL PROTEIN: 5.6 g/dL — AB (ref 6.5–8.1)

## 2017-12-30 LAB — PHOSPHORUS: PHOSPHORUS: 4 mg/dL (ref 2.5–4.6)

## 2017-12-30 LAB — MAGNESIUM: MAGNESIUM: 2 mg/dL (ref 1.7–2.4)

## 2017-12-30 LAB — PREALBUMIN: PREALBUMIN: 15 mg/dL — AB (ref 18–38)

## 2017-12-30 LAB — TRIGLYCERIDES: TRIGLYCERIDES: 84 mg/dL (ref <150)

## 2017-12-30 MED ORDER — ALBUTEROL SULFATE HFA 108 (90 BASE) MCG/ACT IN AERS: 2.0000 | INHALATION_SPRAY | RESPIRATORY_TRACT | Status: DC | PRN

## 2017-12-30 MED ORDER — FLUTICASONE FUROATE-VILANTEROL 200-25 MCG/INH IN AEPB
1.0000 | INHALATION_SPRAY | Freq: Every day | RESPIRATORY_TRACT | Status: DC
  Administered 2017-12-30 – 2017-12-31 (×2): 1 via RESPIRATORY_TRACT
  Filled 2017-12-30: qty 28

## 2017-12-30 MED ORDER — MOMETASONE FURO-FORMOTEROL FUM 200-5 MCG/ACT IN AERO: 2.0000 | INHALATION_SPRAY | Freq: Two times a day (BID) | RESPIRATORY_TRACT | Status: DC

## 2017-12-30 MED ORDER — ALBUTEROL SULFATE (2.5 MG/3ML) 0.083% IN NEBU: 2.5000 mg | INHALATION_SOLUTION | RESPIRATORY_TRACT | Status: DC | PRN

## 2017-12-30 NOTE — Progress Notes (Signed)
Nutrition Follow-up  DOCUMENTATION CODES:   Obesity unspecified  INTERVENTION:   Discontinue TPN today   Continue Ensure Enlive po TID, each supplement provides 350 kcal and 20 grams of protein  NUTRITION DIAGNOSIS:   Increased nutrient needs related to post-op healing as evidenced by estimated needs.  Ongoing.  GOAL:   Patient will meet greater than or equal to 90% of their needs  Met with TPN and diet advancement. Also addressing with addition of Ensure Enlive today.  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   -Diet advanced to clear liquids on 4/28, then full liquids on 5/4, and soft diet (5/5).  -IV Access: right basilic triple lumen PICC placed 12/21/2017; ECG used to determine [facility] term[facility] at [facility]  Pt tolerating soft diet; will plan to discontinue TPN today per MD. Continue Ensure supplements. Increased ostomy output today. Per chart, pt with 6lb(3%) wt loss since admit; RD will continue to monitor.    Medications reviewed and include: Novolog 0-15 units Q4hrs (received 20 units in past 24 hrs), Cipro, famotidine, Flagyl.  Labs reviewed: Na 134(L), K 3.9 wnl, Ca 7.7(L) adj. 8.9 wnl, P 4.0 wnl, Mg 2.0 wnl, alb 2.5(L), prealbumin 15.0(L) Triglycerides- 84 Wbc- 13.7(H) cbgs- 139, 134, 162, 153, 147 x 24 hrs  Diet Order:   Diet Order           DIET SOFT Room service appropriate? Yes; Fluid consistency: Thin  Diet effective now         EDUCATION NEEDS:   Education needs have been addressed  Skin:  Skin Assessment: Skin Integrity Issues: Skin Integrity Issues:: Incisions, Other (Comment) Incisions: closed incision to abdomen Other: ecchymosis to bilateral arms  Last BM:   5/5- 138m type 6  Height:   Ht Readings from Last 1 Encounters:  12/16/17 '5\' 9"'$  (1.753 m)    Weight:   Wt Readings from Last 1 Encounters:  12/30/17 209 lb 11.2 oz (95.1 kg)    Ideal Body Weight:  72.7 kg  BMI:  Body mass index is 30.97 kg/m.  Estimated Nutritional Needs:   Kcal:  2130-2310 (MSJ x 1.2-1.3)  Protein:  105-125 grams (1.1-1.3 grams/kg)  Fluid:  1.8-2.2 L/day (25-30 kcal/kg IBW)  CKoleen DistanceMS, RD, LDN Pager #- [phone] Hours Pager: [phone]

## 2017-12-30 NOTE — Progress Notes (Signed)
12/30/2017  Subjective: Patient is 14 Days Post-Op s/p exlap and Hartmann's for perforated diverticulitis.  Patient with prolonged ileus which is finally improved.  His diet was advanced to soft and he's tolerating well.  His main complaint is some discomfort with the JP drains.  Vital signs: Temp:  [98.3 F (36.8 C)-98.7 F (37.1 C)] 98.3 F (36.8 C) (05/06 0537) Pulse Rate:  [97-104] 98 (05/06 0537) Resp:  [20-24] 20 (05/06 0537) BP: (101-125)/(66-69) 101/67 (05/06 0537) SpO2:  [95 %-97 %] 95 % (05/06 0537) Weight:  [209 lb 11.2 oz (95.1 kg)] 209 lb 11.2 oz (95.1 kg) (05/06 0500)   Intake/Output: 05/05 0701 - 05/06 0700 In: 1916 [P.O.:600; I.V.:1316] Out: 1285 [Urine:975; Drains:10; Stool:300] Last BM Date: 12/29/17  Physical Exam: Constitutional: No acute distress Abdomen:  Soft, nondistended, appropriately tender to palpation.  Incision clean, dry, intact.  JP drains with purulent fluid.  Ostomy pink and viable with stool in bag.  Labs:  Recent Labs    12/29/17 0457 12/30/17 0513  WBC 15.3* 13.7*  HGB 11.5* 11.5*  HCT 33.7* 33.8*  PLT 396 442*   Recent Labs    12/28/17 0505 12/30/17 0513  NA 133* 134*  K 3.6 3.9  CL 102 101  CO2 23 24  GLUCOSE 192* 173*  BUN 15 17  CREATININE 0.57* 0.86  CALCIUM 7.5* 7.7*   No results for input(s): LABPROT, INR in the last 72 hours.  Imaging: No results found.  Assessment/Plan: 62 yo male s/p exlap and Hartmann's   --will d/c TPN today and continue soft diet and supplements --will transition to po abx tomorrow.  Given purulent drainage and still some remaining fluid on prior CT scan from 5/2 will likely need a more prolonged course of abx. --oob, ambulate --will keep JP drains given purulent fluid.  Low output. --possible discharge over next two days.   Melvyn Neth, [place]

## 2017-12-31 LAB — BASIC METABOLIC PANEL
ANION GAP: 9 (ref 5–15)
BUN: 19 mg/dL (ref 6–20)
CALCIUM: 7.9 mg/dL — AB (ref 8.9–10.3)
CO2: 23 mmol/L (ref 22–32)
Chloride: 103 mmol/L (ref 101–111)
Creatinine, Ser: 0.79 mg/dL (ref 0.61–1.24)
GLUCOSE: 126 mg/dL — AB (ref 65–99)
Potassium: 4.1 mmol/L (ref 3.5–5.1)
Sodium: 135 mmol/L (ref 135–145)

## 2017-12-31 LAB — CBC WITH DIFFERENTIAL/PLATELET
BASOS ABS: 0.1 10*3/uL (ref 0–0.1)
BASOS PCT: 1 %
EOS PCT: 2 %
Eosinophils Absolute: 0.2 10*3/uL (ref 0–0.7)
HCT: 34.3 % — ABNORMAL LOW (ref 40.0–52.0)
Hemoglobin: 11.7 g/dL — ABNORMAL LOW (ref 13.0–18.0)
Lymphocytes Relative: 13 %
Lymphs Abs: 1.5 10*3/uL (ref 1.0–3.6)
MCH: 30.6 pg (ref 26.0–34.0)
MCHC: 34 g/dL (ref 32.0–36.0)
MCV: 90 fL (ref 80.0–100.0)
MONO ABS: 0.7 10*3/uL (ref 0.2–1.0)
Monocytes Relative: 7 %
Neutro Abs: 8.5 10*3/uL — ABNORMAL HIGH (ref 1.4–6.5)
Neutrophils Relative %: 77 %
PLATELETS: 390 10*3/uL (ref 150–440)
RBC: 3.81 MIL/uL — ABNORMAL LOW (ref 4.40–5.90)
RDW: 14.6 % — AB (ref 11.5–14.5)
WBC: 11 10*3/uL — ABNORMAL HIGH (ref 3.8–10.6)

## 2017-12-31 LAB — MAGNESIUM: Magnesium: 1.9 mg/dL (ref 1.7–2.4)

## 2017-12-31 MED ORDER — OXYCODONE-ACETAMINOPHEN 5-325 MG PO TABS: 1.0000 | ORAL_TABLET | ORAL | 0 refills | Status: DC | PRN

## 2017-12-31 MED ORDER — METRONIDAZOLE 500 MG PO TABS
500.0000 mg | ORAL_TABLET | Freq: Three times a day (TID) | ORAL | Status: DC
  Administered 2017-12-31 (×2): 500 mg via ORAL
  Filled 2017-12-31 (×3): qty 1

## 2017-12-31 MED ORDER — ENSURE ENLIVE PO LIQD: 237.0000 mL | Freq: Three times a day (TID) | ORAL | 12 refills | Status: DC

## 2017-12-31 MED ORDER — IBUPROFEN 600 MG PO TABS: 600.0000 mg | ORAL_TABLET | Freq: Three times a day (TID) | ORAL | 0 refills | Status: AC | PRN

## 2017-12-31 MED ORDER — METRONIDAZOLE 500 MG PO TABS: 500.0000 mg | ORAL_TABLET | Freq: Three times a day (TID) | ORAL | 0 refills | Status: DC

## 2017-12-31 MED ORDER — CIPROFLOXACIN HCL 500 MG PO TABS: 500.0000 mg | ORAL_TABLET | Freq: Two times a day (BID) | ORAL | 0 refills | Status: DC

## 2017-12-31 MED ORDER — CIPROFLOXACIN HCL 500 MG PO TABS
500.0000 mg | ORAL_TABLET | Freq: Two times a day (BID) | ORAL | Status: DC
  Administered 2017-12-31: 500 mg via ORAL
  Filled 2017-12-31: qty 1

## 2017-12-31 NOTE — Progress Notes (Signed)
Pt discharged per MD order. IV and PICC removed with no complications. Pts dressing change performed by wife at bedside with RN. Pt and wife verbalized understanding. Demonstrated to pt how to empty JP drain and instructed to document output for follow-up appt. Prescription given to pt. All questions answered to pt and wife satisfaction. Pt taken downstairs by staff.

## 2017-12-31 NOTE — Care Management Note (Addendum)
Case Management Note  Patient Details  Name: Jonathon Lee MRN: [id number] Date of Birth: 1956-02-10   Corene Cornea with [facility] notified of discharge for home health services for management of new ostomy and wound care.  Subjective/Objective:                    Action/Plan:   Expected Discharge Date:  12/31/17               Expected Discharge Plan:  [place]  In-House Referral:     Discharge planning Services  CM Consult  Post Acute Care Choice:  Home Health Choice offered to:  Patient  DME Arranged:    DME Agency:     HH Arranged:  RN [place] Agency:  [facility]  Status of Service:  Completed, signed off  If discussed at [place] of Stay Meetings, dates discussed:    Additional Comments:  Beverly Sessions, RN 12/31/2017, 9:46 AM

## 2017-12-31 NOTE — Consult Note (Signed)
[facility] Nurse ostomy follow up Stoma type/location: LLQ, end colostomy Stomal assessment/size: budded slightly, oval shaped 1 1/4", mucosa continues to slough Peristomal assessment: intact Treatment options for stomal/peristomal skin: using 2" barrier ring to aid in seal Output liquid, brown Ostomy pouching: 2pc. 2 1/4"  Education provided:  Explained stoma characteristics, continued sloughing of stoma may occur, will look like mucous Patient demonstrated pouch change (cutting new skin barrier with assistance.  He is able to cut new wafer and use pattern from previous barrier.  He requires assistance to place skin barrier, he is unable to see the stoma in a sitting position.  Will need to use bathroom mirror and have assistance from wife at home.  Will some assistance patient is able to attach pouch to his skin barrier.  Patient is independent emptying his pouch Provided patient with [employer] and marked items currently using      Enrolled patient in [employer] Discharge program: Yes  [place] Nurse will follow along with you for continued support with ostomy teaching and care [place] MSN, [place], [place], [place], [place]

## 2017-12-31 NOTE — Discharge Summary (Signed)
Patient ID: Jonathon Lee MRN: [id number] DOB/AGE: [birth year] 62 y.o.  Admit date: 12/16/2017 Discharge date: 12/31/2017   Discharge Diagnoses:  Active Problems:   Diverticulitis of colon with perforation   Procedures:  Exploratory laparotomy, Hartmann's Procedure  Hospital Course:  Patient was admitted on 12/16/17 with perforated diverticulitis with purulent peritonitis.  He was taken to the OR on 4/22 and had an exploratory laparotomy and Hartmann's procedure.  He had a very prolonged post-op ileus and required TPN via PICC line for nutrition.  He finally regained bowel function and his diet was slowly advanced.  He also had times of increased leukocytosis and had two CT scans done showing intra-abdominal fluid collections which were small and decreasing in size.  He has two JP drains which are still draining purulent fluid and will remain in place.  His midline incision was left open and has been healing well with wet to dry dressing changes.  He has been getting ostomy teaching.  He has completed two weeks of IV antibiotic and will have one more week of oral antibiotic given his fluid collections.  On exam, he was in no acute distress and with stable vital signs.  His abdomen was soft, non-distended, appropriately tender to palpation.  Midline incision was open, with healthy granulation tissue at the bed with wet to dry dressing change.  JP drains with low volume of purulent fluid.  Ostomy pink, viable, with stool and gas in bag.  Consults:  Ostomy RN  Disposition: Discharge disposition: 01-Home or Self Care       Discharge Instructions    Call MD for:  difficulty breathing, headache or visual disturbances   Complete by:  As directed    Call MD for:  persistant nausea and vomiting   Complete by:  As directed    Call MD for:  redness, tenderness, or signs of infection (pain, swelling, redness, odor or green/yellow discharge around incision site)   Complete by:  As directed    Call MD  for:  severe uncontrolled pain   Complete by:  As directed    Call MD for:  temperature >100.4   Complete by:  As directed    Change dressing (specify)   Complete by:  As directed    Midline incision:  Wet to dry gauze dressing change once daily JP drain sites:  Dry gauze dressing change once daily or as needed Ostomy:  Follow instructions from ostomy nurse.   Diet - low sodium heart healthy   Complete by:  As directed    Discharge instructions   Complete by:  As directed    1.  Patient may shower, but do not scrub wound heavily and dab dry only. 2.  Do not submerge wounds in pool/tub 3.  Please record daily JP drain output and bring record with you to clinic appointment.   Driving Restrictions   Complete by:  As directed    Do not drive while taking narcotics for pain control.   Increase activity slowly   Complete by:  As directed    Lifting restrictions   Complete by:  As directed    No heavy lifting or pushing of more than 10-15 lbs for 4 weeks.     Allergies as of 12/31/2017      Reactions   Zosyn [piperacillin Sod-tazobactam So] Hives      Medication List    TAKE these medications   ADVAIR DISKUS 250-50 MCG/DOSE Aepb Generic drug:  Fluticasone-Salmeterol Inhale 1 puff into  the lungs every 12 (twelve) hours.   albuterol 108 (90 Base) MCG/ACT inhaler Commonly known as:  PROVENTIL HFA;VENTOLIN HFA Inhale 2 puffs into the lungs every 4 (four) hours as needed for wheezing.   amitriptyline 50 MG tablet Commonly known as:  ELAVIL Take 50 mg by mouth at bedtime.   cholecalciferol 1000 units tablet Commonly known as:  VITAMIN D Take 5,000 Units by mouth daily.   ciprofloxacin 500 MG tablet Commonly known as:  CIPRO Take 1 tablet (500 mg total) by mouth 2 (two) times daily.   feeding supplement (ENSURE ENLIVE) Liqd Take 237 mLs by mouth 3 (three) times daily between meals.   ibuprofen 600 MG tablet Commonly known as:  ADVIL,MOTRIN Take 1 tablet (600 mg total) by  mouth every 8 (eight) hours as needed.   metroNIDAZOLE 500 MG tablet Commonly known as:  FLAGYL Take 1 tablet (500 mg total) by mouth every 8 (eight) hours. What changed:  when to take this   oxyCODONE-acetaminophen 5-325 MG tablet Commonly known as:  PERCOCET/ROXICET Take 1 tablet by mouth every 30 (thirty) minutes as needed for moderate pain (May repeat x1 in 30 minutes prn for continued moderate pain.If pt requires a second dose, advise EDP.).            Discharge Care Instructions  (From admission, onward)        Start     Ordered   12/31/17 0000  Change dressing (specify)    Comments:  Midline incision:  Wet to dry gauze dressing change once daily JP drain sites:  Dry gauze dressing change once daily or as needed Ostomy:  Follow instructions from ostomy nurse.   12/31/17 0900     Follow-up Information    Pabon, Stacey Street, MD Follow up in 1 week(s).   Specialty:  General Surgery Contact information: [address] [place] [address] [phone]

## 2018-01-01 ENCOUNTER — Other Ambulatory Visit: Payer: Self-pay

## 2018-01-06 ENCOUNTER — Telehealth: Payer: Self-pay

## 2018-01-06 NOTE — Telephone Encounter (Signed)
Flagged on EMMI report for not reading discharge papers.  Called and spoke with patient.  He mentioned he has read over his discharge papers since time of automated call.  No questions or concerns at this time.  I thanked him for his time and made him aware that he would receive one more automated call in the next few days as a final check up.

## 2018-01-06 NOTE — Telephone Encounter (Signed)
Spoke with patient at this time. He currently has 2 JP drains and he is concerned of the low volume from one of the drains.  He states the tubing has some fluid but is not emptying as much as when he first had the tubes placed.  We discussed making sure he is squeezing the bulb and closing the lid as this causes the drain  to work on vacuum. I also let him know that he is healing and there may not be as much fluid as he had in the beginning.  He is scheduled to be seen 01/08/18 and was instructed to keep recording output. He states the drain sites look good, denies fever and feels pretty good.

## 2018-01-08 ENCOUNTER — Ambulatory Visit (INDEPENDENT_AMBULATORY_CARE_PROVIDER_SITE_OTHER): Payer: BC Managed Care – PPO | Admitting: Surgery

## 2018-01-08 ENCOUNTER — Encounter: Payer: Self-pay | Admitting: Surgery

## 2018-01-08 VITALS — BP 124/76 | HR 116 | Temp 98.4°F | Ht 69.0 in | Wt 198.0 lb

## 2018-01-08 DIAGNOSIS — Z09 Encounter for follow-up examination after completed treatment for conditions other than malignant neoplasm: Secondary | ICD-10-CM

## 2018-01-08 NOTE — Patient Instructions (Addendum)
Please continue the wet to dry dressing changes twice daily. You may remove all packing and dressings and shower as usual.   Please continue to eat throughout the day as this will help to gain your strength back.   No lifting at this time.    Please see your follow up appointment listed below.

## 2018-01-08 NOTE — Progress Notes (Signed)
S/p Hartmann's  Doing well Colostomy working Open wound healing by secondary intention + PO, no fevers Minimal serous output from drain  PE NAD Open midline wound healing very well w good granulation, no infection. Drain removed  A/P Doing very well we will see him in 1-2 months  No surgical issues at this time

## 2018-01-09 ENCOUNTER — Encounter: Payer: Self-pay | Admitting: Surgery

## 2018-01-13 ENCOUNTER — Telehealth: Payer: Self-pay | Admitting: Surgery

## 2018-01-13 NOTE — Telephone Encounter (Signed)
Patient is calling said he has some questions regarding some medication. Please call patient and advise.

## 2018-01-13 NOTE — Telephone Encounter (Signed)
Spoke with patient and he ask if he can get something for his sinus infection. He has a cough as well. He stated his incision looks great. No redness, no drainage.   Patient was instructed to call his PCP as we only treat surgical issues.

## 2018-01-16 ENCOUNTER — Telehealth: Payer: Self-pay | Admitting: Surgery

## 2018-01-16 NOTE — Telephone Encounter (Signed)
Patient left message for status on ostomy supplies - please advise

## 2018-01-21 NOTE — Telephone Encounter (Signed)
I have called patient to follow up on the status of the ostomy supplies. Patient states that he has received all supplies and is happy with all his care. Patient will call if he needs anything else.

## 2018-01-23 ENCOUNTER — Telehealth: Payer: Self-pay

## 2018-01-23 NOTE — Telephone Encounter (Signed)
Misty from Home health called asking for a verbal order for her to continue with patient's abdominal wound care.  Patient had a Laparotomy Hartmann's procedure takedown of splenic flexure on 12/16/2017 by Dr. Dahlia Byes. I told Misty that it was okay for her to continue his wound care. She stated that her company would fax [place] a form to sign and return. I told her that it was fine. No further questions at this time.

## 2018-02-07 ENCOUNTER — Ambulatory Visit (INDEPENDENT_AMBULATORY_CARE_PROVIDER_SITE_OTHER): Payer: BC Managed Care – PPO | Admitting: Surgery

## 2018-02-07 ENCOUNTER — Encounter: Payer: Self-pay | Admitting: Surgery

## 2018-02-07 VITALS — BP 127/82 | HR 108 | Temp 97.3°F | Ht 69.0 in | Wt 194.8 lb

## 2018-02-07 DIAGNOSIS — Z09 Encounter for follow-up examination after completed treatment for conditions other than malignant neoplasm: Secondary | ICD-10-CM

## 2018-02-07 NOTE — Patient Instructions (Signed)
Please continue to change the dressing daily.   Please see your follow up appointment listed below.

## 2018-02-07 NOTE — Progress Notes (Signed)
Outpatient postop visit  02/07/2018  Jonathon Lee is an 62 y.o. male.    Procedure: Hartman's procedure by Dr. Dahlia Byes  CC: No problems except sinus infection  HPI: Patient has no complaints he is gaining weight.  He has an open wound and his ostomy is functional  Medications reviewed.    Physical Exam:  There were no vitals taken for this visit.    PE: Wound is clean with granulated tissue silver nitrate is applied ostomy is functional    Assessment/Plan:  Patient doing very well at this point recommend seeing him back in 2 weeks for silver nitrate application.  He would be ready for closure of his colostomy in another 6 to 8 weeks but will require colonoscopy as well which can be arranged in 6 weeks.  Florene Glen, MD, FACS

## 2018-02-21 ENCOUNTER — Encounter: Payer: Self-pay | Admitting: Surgery

## 2018-02-21 ENCOUNTER — Ambulatory Visit (INDEPENDENT_AMBULATORY_CARE_PROVIDER_SITE_OTHER): Payer: BC Managed Care – PPO | Admitting: Surgery

## 2018-02-21 VITALS — BP 98/65 | HR 98 | Temp 98.3°F | Ht 69.0 in | Wt 200.0 lb

## 2018-02-21 DIAGNOSIS — Z09 Encounter for follow-up examination after completed treatment for conditions other than malignant neoplasm: Secondary | ICD-10-CM

## 2018-02-21 NOTE — Progress Notes (Signed)
S/p Hartmann's 2 months ago perf div Doing very well Getting his streght + PO, colostomy working No F/ chills  PE NAD Abd: soft, small 4 cm superficial wound healing well by secondary intention. Bandaid placed  A/P DOing very well Colonoscopy F/U 6 weeks to discuss colostomy takedown

## 2018-02-21 NOTE — Patient Instructions (Signed)
We will refer you to [facility] so they could see you and schedule a colonoscopy.   After you have your colonoscopy, then we will schedule a follow up with Dr. Dahlia Byes to discuss colostomy reversal.

## 2018-02-28 ENCOUNTER — Telehealth: Payer: Self-pay | Admitting: Surgery

## 2018-02-28 NOTE — Telephone Encounter (Signed)
Patient has called an left a message about billing. He states that he has not received a bill from the surgery he had with Dr Dahlia Byes on 12/16/17.  I will call the patient when office opens on 03/03/18.

## 2018-03-21 ENCOUNTER — Other Ambulatory Visit: Payer: Self-pay | Admitting: Student

## 2018-03-21 DIAGNOSIS — N2889 Other specified disorders of kidney and ureter: Secondary | ICD-10-CM

## 2018-03-21 DIAGNOSIS — Z8719 Personal history of other diseases of the digestive system: Secondary | ICD-10-CM

## 2018-03-24 ENCOUNTER — Encounter: Payer: Self-pay | Admitting: Surgery

## 2018-03-27 DIAGNOSIS — N289 Disorder of kidney and ureter, unspecified: Secondary | ICD-10-CM

## 2018-03-27 HISTORY — DX: Disorder of kidney and ureter, unspecified: N28.9

## 2018-04-08 ENCOUNTER — Ambulatory Visit
Admission: RE | Admit: 2018-04-08 | Discharge: 2018-04-08 | Disposition: A | Discharge status: Home / Self Care | Payer: BC Managed Care – PPO | Source: Ambulatory Visit | Attending: Student | Admitting: Student

## 2018-04-08 DIAGNOSIS — Z8719 Personal history of other diseases of the digestive system: Secondary | ICD-10-CM | POA: Insufficient documentation

## 2018-04-08 DIAGNOSIS — N2889 Other specified disorders of kidney and ureter: Secondary | ICD-10-CM | POA: Diagnosis not present

## 2018-04-08 DIAGNOSIS — I700 Atherosclerosis of aorta: Secondary | ICD-10-CM | POA: Insufficient documentation

## 2018-04-08 DIAGNOSIS — N281 Cyst of kidney, acquired: Secondary | ICD-10-CM | POA: Diagnosis not present

## 2018-04-08 MED ORDER — GADOBENATE DIMEGLUMINE 529 MG/ML IV SOLN
20.0000 mL | Freq: Once | INTRAVENOUS | Status: AC | PRN
  Administered 2018-04-08: 19 mL via INTRAVENOUS

## 2018-04-09 ENCOUNTER — Telehealth: Payer: Self-pay | Admitting: Surgery

## 2018-04-09 NOTE — Telephone Encounter (Signed)
Called [place], [place] in reference to patient's care. She stated that she wanted Dr. Dahlia Byes to know the results of the MRI that they had ordered. She stated the following information on his MRI: benign renal cysts were noted bilatrally and remains stable. However, the lesion we discussed in office was new on CT in 11/2016 and 01/2017, and increased by 0.5 cm between 01/2018 and 11/2017. The other cysts remain stable. Sharyn Lull wanted me to relate this message to Dr. Dahlia Byes. Therefore, I sent a staff message to Dr. Dahlia Byes. Awaiting on his response.

## 2018-04-09 NOTE — Telephone Encounter (Signed)
[facility] and is asking for one of the nurses to give her a call back in regards to this patient she can be reached at [phone]. Please call and advise.

## 2018-05-01 ENCOUNTER — Encounter: Payer: Self-pay | Admitting: Surgery

## 2018-05-01 ENCOUNTER — Telehealth: Payer: Self-pay

## 2018-05-01 NOTE — Telephone Encounter (Signed)
Letter mailed to the patient to contact our office.

## 2018-05-01 NOTE — Telephone Encounter (Signed)
----- Message from Larna Daughters sent at 05/01/2018 11:32 AM EDT ----- Regarding: RE: appt I have mailed a letter to the patient to contact our office.  ----- Message ----- From: Wayna Chalet, CMA Sent: 04/30/2018   9:18 AM EDT To: Larna Daughters Subject: appt                                           Can you please schedule an appointment for patient to be seen by Dr. Dahlia Byes to go over his colonoscopy results from 04/24/2018. Thank you!   ----- Message ----- From: Wayna Chalet, CMA Sent: 04/25/2018 To: Wayna Chalet, CMA Subject: RE: referral                                   Thursday August 29th, 2019 Is colonoscopy Look for results and then schedule a f/u appt with Dr. Dahlia Byes  ----- Message ----- From: Mickie Kay Sent: 03/21/2018   1:03 PM To: Wayna Chalet, CMA Subject: RE: referral                                    Thursday August 29th, 2019 Is colonoscopy ----- Message ----- From: Wayna Chalet, CMA Sent: 03/21/2018  11:27 AM To: Albin Felling Brouillard Subject: RE: referral                                   Angie, is there any way that we could send a message to [facility] letting them know that the patient needs a colonoscopy done before Dr. Dahlia Byes could do a colostomy takedown? I only saw a telephone call requiring patient to have a MRI but no appt was made nor colonoscopy scheduled. Please let me know. Thank you!   ----- Message ----- From: Wayna Chalet, CMA Sent: 03/20/2018 To: Wayna Chalet, CMA Subject: RE: referral                                    Patient will see [facility] on 03/19/2018 and then they will schedule his colonoscopy. Look and see for when and then schedule a follow up with Dr. Dahlia Byes. ----- Message ----- From: Mickie Kay Sent: 02/24/2018   4:48 PM To: Wayna Chalet, CMA Subject: RE: referral                                   Pt has been referred and Anderson Malta is calling patient today to offer him an office visit for  tomorrow. ----- Message ----- From: Wayna Chalet, CMA Sent: 02/21/2018   9:16 AM To: Albin Felling Brouillard Subject: referral                                       Can you please refer patient to have a colonoscopy in 6 weeks. Diagnosis: Acute Diverticulitis Referring Doc: Dr. Dahlia Byes  Patient requested to be seen by Dr. Vira Agar at [facility].

## 2018-05-01 NOTE — Telephone Encounter (Signed)
We have not been able to get in contact with the patient. Therefore, Jonathon Lee sent him a letter. We hope that he calls back so the physician could go over his colonoscopy results and what to follow.

## 2018-05-07 ENCOUNTER — Ambulatory Visit: Payer: BC Managed Care – PPO | Admitting: Surgery

## 2018-05-07 ENCOUNTER — Encounter: Payer: Self-pay | Admitting: Surgery

## 2018-05-07 VITALS — BP 122/75 | HR 95 | Temp 98.2°F | Resp 20 | Ht 69.0 in | Wt 206.0 lb

## 2018-05-07 DIAGNOSIS — Z433 Encounter for attention to colostomy: Secondary | ICD-10-CM

## 2018-05-07 NOTE — Patient Instructions (Addendum)
The patient is aware to call back for any questions or new concerns.  May remove dressing on Friday and apply band aid as needed.  Follow up in November  Recommend exercise daily (walking) and weight loss discussed to improve optimum health for his surgeries

## 2018-05-08 NOTE — Progress Notes (Signed)
Outpatient Surgical Follow Up  05/08/2018  Jonathon Lee is an 62 y.o. male.   Chief Complaint  Patient presents with  . Follow-up    discussion     HPI: 62 year old status post Hartman's procedure 5 months ago for perforated diverticulitis 11/2017.  He is following up for potential colostomy takedown.  Part of his work-up included a CT scan that revealed evidence of a right renal mass ( personally reviewed) that prompted an MRI.  He has been seen by urology at a tertiary center and he is scheduled for a partial nephrectomy in about 4 to 6 weeks.  So completed a colonoscopy by Dr. Vira Agar without any major abnormalities.  He is recovered from his Hartman's and is doing well.  No fevers no chills he is taking p.o. and he is colostomy is working well  Past Medical History:  Diagnosis Date  . Asthma   . Diverticulitis   . Renal lesion 03/2018    Past Surgical History:  Procedure Laterality Date  . LAPAROTOMY N/A 12/16/2017   Procedure: EXPLORATORY LAPAROTOMY/ SIGMOID COLECTOMY, colostomy;  Surgeon: Jules Husbands, MD;  Location: [facility] ORS;  Service: General;  Laterality: N/A;    No family history on file.  Social History:  reports that he has quit smoking. He has never used smokeless tobacco. He reports that he does not drink alcohol or use drugs.  Allergies:  Allergies  Allergen Reactions  . Zosyn [Piperacillin Sod-Tazobactam So] Hives    Medications reviewed.    ROS Full ROS performed and is otherwise negative other than what is stated in HPI   BP 122/75   Pulse 95   Temp 98.2 F (36.8 C) (Skin)   Resp 20   Ht 5\' 9"  (1.753 m)   Wt 206 lb (93.4 kg)   SpO2 97%   BMI 30.42 kg/m   Physical Exam  Constitutional: He is oriented to person, place, and time. He appears well-developed and well-nourished. No distress.  Neck: Normal range of motion. No JVD present. No tracheal deviation present. No thyromegaly present.  Cardiovascular: Normal rate and regular rhythm.   Pulmonary/Chest: Effort normal and breath sounds normal. No stridor. No respiratory distress. He has no wheezes.  Abdominal: Soft. He exhibits no distension and no mass. There is no tenderness. There is no rebound and no guarding.  Colostomy in place, patent and working  Musculoskeletal: Normal range of motion. He exhibits no edema or deformity.  Neurological: He is alert and oriented to person, place, and time. He displays normal reflexes. No cranial nerve deficit or sensory deficit. He exhibits normal muscle tone. Coordination normal.  Skin: Skin is warm and dry. Capillary refill takes less than 2 seconds.  Psychiatric: He has a normal mood and affect. His behavior is normal. Judgment and thought content normal.  Nursing note and vitals reviewed.     Assessment/Plan: 62 year old status post Hartmann procedure for perforated diverticulitis now with newly diagnosed right renal cell carcinoma scheduled for partial nephrectomy at [facility] in about a month and a half.  Had discussed with the patient in detail and he is interested in colostomy takedown.  Given that the cancer take precedence I do recommend colostomy takedown after he recovers from partial nephrectomy.  I have discussed extensively with the patient about my thought process. I Spent at least 25 minutes in this encounter with greater than 50% of the time spent in coordination and counseling    Caroleen Hamman, MD [place] Surgeon

## 2018-05-09 ENCOUNTER — Encounter: Payer: Self-pay | Admitting: Surgery

## 2018-07-04 DIAGNOSIS — C649 Malignant neoplasm of unspecified kidney, except renal pelvis: Secondary | ICD-10-CM

## 2018-07-04 HISTORY — DX: Malignant neoplasm of unspecified kidney, except renal pelvis: C64.9

## 2018-07-21 ENCOUNTER — Encounter: Payer: Self-pay | Admitting: Surgery

## 2018-07-21 ENCOUNTER — Other Ambulatory Visit: Payer: Self-pay

## 2018-07-21 ENCOUNTER — Encounter: Payer: Self-pay | Admitting: *Deleted

## 2018-07-21 ENCOUNTER — Ambulatory Visit (INDEPENDENT_AMBULATORY_CARE_PROVIDER_SITE_OTHER): Payer: BC Managed Care – PPO | Admitting: Surgery

## 2018-07-21 VITALS — BP 118/78 | HR 85 | Temp 97.7°F | Resp 13 | Ht 69.0 in | Wt 192.0 lb

## 2018-07-21 DIAGNOSIS — K439 Ventral hernia without obstruction or gangrene: Secondary | ICD-10-CM | POA: Diagnosis not present

## 2018-07-21 NOTE — Progress Notes (Signed)
Outpatient Surgical Follow Up  07/21/2018  Criag Wicklund is an 62 y.o. male.   Chief Complaint  Patient presents with  . Follow-up    HPI: Toula Moos is a 62 year old well-known to me with a prior history of a Jeanette Caprice procedure for perforated diverticulitis on April 2019 more recently he underwent a partial nephrectomy for right kidney neoplasms this was performed about 2 weeks ago.  He is still recovering from this.  Does report some weakness and that he feels tired.  No fevers no chills he is eating well and his colostomy is working well.  Denies any abdominal l pain.  Past Medical History:  Diagnosis Date  . Asthma   . Diverticulitis   . Renal lesion 03/2018    Past Surgical History:  Procedure Laterality Date  . LAPAROTOMY N/A 12/16/2017   Procedure: EXPLORATORY LAPAROTOMY/ SIGMOID COLECTOMY, colostomy;  Surgeon: Jules Husbands, MD;  Location: [facility] ORS;  Service: General;  Laterality: N/A;    History reviewed. No pertinent family history.  Social History:  reports that he has quit smoking. He has never used smokeless tobacco. He reports that he does not drink alcohol or use drugs.  Allergies:  Allergies  Allergen Reactions  . Zosyn [Piperacillin Sod-Tazobactam So] Hives    Medications reviewed.    ROS Full ROS performed and is otherwise negative other than what is stated in HPI   BP 118/78   Pulse 85   Temp 97.7 F (36.5 C) (Skin)   Resp 13   Ht 5\' 9"  (1.753 m)   Wt 192 lb (87.1 kg)   SpO2 98%   BMI 28.35 kg/m   Physical Exam  Constitutional: He is oriented to person, place, and time. He appears well-developed and well-nourished.  Neck: Normal range of motion. No JVD present. No tracheal deviation present. No thyromegaly present.  Cardiovascular: Normal rate, regular rhythm and normal heart sounds.  Pulmonary/Chest: Effort normal and breath sounds normal. No stridor. No respiratory distress. He has no wheezes.  Abdominal: Soft. He exhibits no distension and  no mass. There is no tenderness. There is no rebound and no guarding.  Colostomy working, ? Ventral hernia around lower incision. Difficult to exclude, one of them is next to the recent port incision. No peritonitis or infection  Musculoskeletal: Normal range of motion. He exhibits no edema.  Neurological: He is alert and oriented to person, place, and time. He displays normal reflexes. No cranial nerve deficit. He exhibits normal muscle tone. Coordination normal.  Skin: Skin is warm and dry.  Psychiatric: He has a normal mood and affect. His behavior is normal. Judgment and thought content normal.  Nursing note and vitals reviewed.   Assessment/Plan: 37-year-old male status post Hartman's procedure and recent partial nephrectomy.  He is interested in colostomy takedown.  On physical exam there is a questionable abdominal wall defect that I would like to interrogate further with a CT scan of the abdomen pelvis for preoperative planning.  He currently is recovering from his most recent kidney surgery but is not quite ready to undergo a major operative intervention.  I would like to wait at least 4 to 6 weeks before going back for a colostomy takedown.  In the interim I will be able to evaluate any potential abdominal wall defect and plan preoperatively a good approach in case of a hernia repair is necessary. I had an Extensive discussion with patient and the family about my thought process.  Greater than 50% of the 25 minutes  visit was spent in counseling/coordination of care   Caroleen Hamman, MD [place] Surgeon

## 2018-07-21 NOTE — Patient Instructions (Addendum)
Patient to return in two weeks . Ct scan should be scheduled.

## 2018-07-21 NOTE — Progress Notes (Signed)
Patient has been scheduled for a CT abdomen/pelvis with contrast at [facility] for 07-29-18 at 10:30 am (arrive 10:15 am). Prep: NPO 4 hours prior b and pick up prep kit. Patient verbalizes understanding.  A follow up appointment has been scheduled for 08-06-18 with Dr. Dahlia Byes. Results to be reviewed at that time.

## 2018-07-29 ENCOUNTER — Ambulatory Visit: Admission: RE | Admit: 2018-07-29 | Payer: BC Managed Care – PPO | Source: Ambulatory Visit

## 2018-07-29 ENCOUNTER — Ambulatory Visit
Admission: RE | Admit: 2018-07-29 | Discharge: 2018-07-29 | Disposition: A | Discharge status: Home / Self Care | Payer: BC Managed Care – PPO | Source: Ambulatory Visit | Attending: Surgery | Admitting: Surgery

## 2018-07-29 DIAGNOSIS — K439 Ventral hernia without obstruction or gangrene: Secondary | ICD-10-CM | POA: Insufficient documentation

## 2018-07-29 HISTORY — DX: Malignant neoplasm of unspecified kidney, except renal pelvis: C64.9

## 2018-07-29 LAB — POCT I-STAT CREATININE: Creatinine, Ser: 1.1 mg/dL (ref 0.61–1.24)

## 2018-07-29 MED ORDER — IOPAMIDOL (ISOVUE-300) INJECTION 61%
100.0000 mL | Freq: Once | INTRAVENOUS | Status: AC | PRN
  Administered 2018-07-29: 100 mL via INTRAVENOUS

## 2018-08-06 ENCOUNTER — Ambulatory Visit: Payer: BC Managed Care – PPO | Admitting: Surgery

## 2018-08-11 ENCOUNTER — Other Ambulatory Visit: Payer: Self-pay

## 2018-08-11 ENCOUNTER — Encounter: Payer: Self-pay | Admitting: *Deleted

## 2018-08-11 ENCOUNTER — Encounter: Payer: Self-pay | Admitting: Surgery

## 2018-08-11 ENCOUNTER — Ambulatory Visit (INDEPENDENT_AMBULATORY_CARE_PROVIDER_SITE_OTHER): Payer: BC Managed Care – PPO | Admitting: Surgery

## 2018-08-11 ENCOUNTER — Telehealth: Payer: Self-pay | Admitting: *Deleted

## 2018-08-11 VITALS — BP 129/84 | HR 88 | Temp 97.7°F | Resp 13 | Ht 67.0 in | Wt 200.0 lb

## 2018-08-11 DIAGNOSIS — Z433 Encounter for attention to colostomy: Secondary | ICD-10-CM

## 2018-08-11 MED ORDER — ERYTHROMYCIN BASE 500 MG PO TABS: ORAL_TABLET | ORAL | 0 refills | Status: DC

## 2018-08-11 MED ORDER — POLYETHYLENE GLYCOL 3350 17 GM/SCOOP PO POWD: ORAL | 0 refills | Status: DC

## 2018-08-11 MED ORDER — BISACODYL 5 MG PO TBEC: DELAYED_RELEASE_TABLET | ORAL | 0 refills | Status: DC

## 2018-08-11 MED ORDER — NEOMYCIN SULFATE 500 MG PO TABS: ORAL_TABLET | ORAL | 0 refills | Status: DC

## 2018-08-11 NOTE — Telephone Encounter (Signed)
Surgery scheduling sheet faxed to the [facility]. Dr. Genevive Bi to assist Dr. Dahlia Byes.   Will contact patient once surgery and phone interview has been scheduled.

## 2018-08-11 NOTE — Progress Notes (Addendum)
Outpatient Surgical Follow Up  08/11/2018  Jonathon Lee is an 62 y.o. male.   Chief Complaint  Patient presents with  . Follow-up    ct scan    HPI: Mr Jonathon Lee following up as part of the preop evaluation for his colostomy takedown.  I have performed a CT scan of the abdomen and pelvis that I have personally review showing no evidence of any parastomal or ventral hernias.  There is however laxity of the abdominal wall lateral to the midline on both sides.  His creatinine is 1.1 and that has increased a little bit from a baseline 0.8.  He is doing otherwise well denies any abdominal pain.  No fevers no chills he is taking p.o. well.  He is now exercising.  From his renal cell carcinoma there is no evidence of recurrence or complications.  Past Medical History:  Diagnosis Date  . Asthma   . Diverticulitis   . 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   . Renal lesion 03/2018    Past Surgical History:  Procedure Laterality Date  . LAPAROTOMY N/A 12/16/2017   Procedure: EXPLORATORY LAPAROTOMY/ SIGMOID COLECTOMY, colostomy;  Surgeon: Jules Husbands, MD;  Location: [facility] ORS;  Service: General;  Laterality: N/A;    History reviewed. No pertinent family history.  Social History:  reports that he has quit smoking. He has never used smokeless tobacco. He reports that he does not drink alcohol or use drugs.  Allergies:  Allergies  Allergen Reactions  . Zosyn [Piperacillin Sod-Tazobactam So] Hives    Medications reviewed.    ROS Full ROS performed and is otherwise negative other than what is stated in HPI   BP 129/84   Pulse 88   Temp 97.7 F (36.5 C) (Skin)   Resp 13   Ht 5\' 7"  (1.702 m)   Wt 200 lb (90.7 kg)   SpO2 98%   BMI 31.32 kg/m   Physical Exam Vitals signs and nursing note reviewed. Exam conducted with a chaperone present.  Constitutional:      Appearance: Normal appearance. He is normal  weight.  Eyes:     General:        Right eye: No discharge.        Left eye: No discharge.     Extraocular Movements: Extraocular movements intact.  Neck:     Musculoskeletal: Normal range of motion and neck supple. No muscular tenderness.  Cardiovascular:     Rate and Rhythm: Normal rate and regular rhythm.     Pulses: Normal pulses.     Heart sounds: No murmur.  Pulmonary:     Effort: Pulmonary effort is normal. No respiratory distress.     Breath sounds: Normal breath sounds. No stridor. No wheezing or rhonchi.  Abdominal:     General: Abdomen is flat. There is no distension.     Palpations: There is no mass.     Tenderness: There is no rebound.     Hernia: No hernia is present.     Comments: Colostomy in place. Some laxity of the abdominal wall c/w diastasis recti  Musculoskeletal: Normal range of motion.  Skin:    General: Skin is warm and dry.     Capillary Refill: Capillary refill takes less than 2 seconds.  Neurological:     General: No focal deficit present.     Mental Status: He is alert.  Psychiatric:  Mood and Affect: Mood normal.        Thought Content: Thought content normal.        Judgment: Judgment normal.         Assessment/Plan:  62 year old male prior Hartman's procedure in need for colostomy takedown.  Discussed with the patient in detail about the procedure.  Risk benefit and possible complications including but not limited to: Bleeding, infection, hernia, need for ostomy, anastomotic leak, chronic pain and prolonged hospitalization. Pending on the degree of diastases and intraoperative findings I may place a reinforcement of absorbable mesh within the retrorectus space to prevent any large ventral hernias. Understands and wishes to proceed.  We will tentatively schedule this in 3 to 5 weeks at patient's convenience.  Greater than 50% of the 25 minutes  visit was spent in counseling/coordination of care   Caroleen Hamman, MD [place]  Surgeon

## 2018-08-11 NOTE — Progress Notes (Signed)
Patient needs to be scheduled for a colostomy takedown with Dr. Dahlia Byes. *Dr. Dahlia Byes will need an assistant for the case.   The patient was given possible surgery dates for January 2020. He wishes to speak with his wife and call the office back to arrange a date.   Patient will need to complete a formal bowel prep. The patient was provided with instructions and these were reviewed with him today. Prescriptions have been sent in to [facility] in [place] per patient request.   The patient is aware he will be contacted by the Pre-admission Testing Department about a week prior to surgery to complete a phone interview.   Will await patient's call regarding surgery date.

## 2018-08-11 NOTE — Telephone Encounter (Signed)
Patient called and wanted to let you know that he has decided to go with Janurary 21st for surgery

## 2018-08-11 NOTE — Patient Instructions (Addendum)
Patient to be scheduled for colostomy takedown. The patient is aware to call back for any questions or concerns.

## 2018-08-13 NOTE — Telephone Encounter (Signed)
Patient contacted this morning and surgery date confirmed-09-16-2018.  The patient is aware he will be contacted on 09-03-18 in the afternoon to complete a phone interview.   The patient is aware to contact the office should he have further questions.

## 2018-09-03 ENCOUNTER — Encounter
Admission: RE | Admit: 2018-09-03 | Discharge: 2018-09-03 | Disposition: A | Discharge status: Home / Self Care | Payer: BC Managed Care – PPO | Source: Ambulatory Visit | Attending: Surgery | Admitting: Surgery

## 2018-09-03 ENCOUNTER — Other Ambulatory Visit: Payer: Self-pay

## 2018-09-03 HISTORY — DX: Other generalized epilepsy and epileptic syndromes, not intractable, without status epilepticus: G40.409

## 2018-09-03 HISTORY — DX: Gastro-esophageal reflux disease without esophagitis: K21.9

## 2018-09-03 NOTE — Patient Instructions (Signed)
Your procedure is scheduled on: 09-16-18 TUESDAY Report to Same Day Surgery 2nd floor medical mall [facility] Entrance-take elevator on left to 2nd floor.  Check in with surgery information desk.) To find out your arrival time please call [phone] between 1PM - 3PM on 09-15-18 MONDAY  Remember: Instructions that are not followed completely may result in serious medical risk, up to and including death, or upon the discretion of your surgeon and anesthesiologist your surgery may need to be rescheduled.    _x___ 1. Do not eat food after midnight the night before your procedure. NO GUM OR CANDY AFTER MIDNIGHT.  You may drink clear liquids up to 2 hours before you are scheduled to arrive at the hospital for your procedure.  Do not drink clear liquids within 2 hours of your scheduled arrival to the hospital.  Clear liquids include  --Water or Apple juice without pulp  --Clear carbohydrate beverage such as ClearFast or Gatorade  --Black Coffee or Clear Tea (No milk, no creamers, do not add anything to the coffee or Tea   ____Ensure clear carbohydrate drink on the way to the hospital for bariatric patients  ____Ensure clear carbohydrate drink 3 hours before surgery for Dr Dwyane Luo patients if physician instructed.    __x__ 2. No Alcohol for 24 hours before or after surgery.   __x__3. No Smoking or e-cigarettes for 24 prior to surgery.  Do not use any chewable tobacco products for at least 6 hour prior to surgery   ____  4. Bring all medications with you on the day of surgery if instructed.    __x__ 5. Notify your doctor if there is any change in your medical condition     (cold, fever, infections).    x___6. On the morning of surgery brush your teeth with toothpaste and water.  You may rinse your mouth with mouth wash if you wish.  Do not swallow any toothpaste or mouthwash.   Do not wear jewelry, make-up, hairpins, clips or nail polish.  Do not wear lotions, powders, or perfumes. You  may wear deodorant.  Do not shave 48 hours prior to surgery. Men may shave face and neck.  Do not bring valuables to the hospital.    [facility] is not responsible for any belongings or valuables.               Contacts, dentures or bridgework may not be worn into surgery.  Leave your suitcase in the car. After surgery it may be brought to your room.  For patients admitted to the hospital, discharge time is determined by your treatment team.  _  Patients discharged the day of surgery will not be allowed to drive home.  You will need someone to drive you home and stay with you the night of your procedure.    Please read over the following fact sheets that you were given:   [facility] Preparing for Surgery   ____ Take anti-hypertensive listed below, cardiac, seizure, asthma, anti-reflux and psychiatric medicines. These include:  1. NONE  2.  3.  4.  5.  6.  ____Fleets enema or Magnesium Citrate as directed.   _x___ Use CHG Soap or sage wipes as directed on instruction sheet   _X___ Use inhalers on the day of surgery and bring to hospital day of surgery-USE ALBUTEROL [place]  ____ Stop Metformin and Janumet 2 days prior to surgery.    ____ Take 1/2 of usual insulin dose  the night before surgery and none on the morning surgery.   ____ Follow recommendations from Cardiologist, Pulmonologist or PCP regarding stopping Aspirin, Coumadin, Plavix ,Eliquis, Effient, or Pradaxa, and Pletal.  X____Stop Anti-inflammatories such as Advil, Aleve, Ibuprofen, Motrin, Naproxen, Naprosyn, Goodies powders or aspirin products NOW-OK to take Tylenol   ____ Stop supplements until after surgery.    ____ Bring C-Pap to the hospital.

## 2018-09-04 ENCOUNTER — Telehealth: Payer: Self-pay | Admitting: *Deleted

## 2018-09-04 NOTE — Telephone Encounter (Signed)
Patient called the office stating that he had trouble getting bowel prep prescriptions that were supposed to have been sent in to [facility] in [place].   The prescriptions were sent in electronically on 08-11-18.   Pharmacy was contacted today who states they don't have prescription.   Verbal order was given to pharmacy.   Patient aware of the above.

## 2018-09-09 ENCOUNTER — Encounter
Admission: RE | Admit: 2018-09-09 | Discharge: 2018-09-09 | Disposition: A | Discharge status: Home / Self Care | Payer: BC Managed Care – PPO | Source: Ambulatory Visit | Attending: Surgery | Admitting: Surgery

## 2018-09-09 DIAGNOSIS — Z01818 Encounter for other preprocedural examination: Secondary | ICD-10-CM | POA: Diagnosis not present

## 2018-09-09 LAB — CBC
HEMATOCRIT: 46.4 % (ref 39.0–52.0)
Hemoglobin: 15.2 g/dL (ref 13.0–17.0)
MCH: 29.7 pg (ref 26.0–34.0)
MCHC: 32.8 g/dL (ref 30.0–36.0)
MCV: 90.6 fL (ref 80.0–100.0)
Platelets: 203 10*3/uL (ref 150–400)
RBC: 5.12 MIL/uL (ref 4.22–5.81)
RDW: 13.1 % (ref 11.5–15.5)
WBC: 7.8 10*3/uL (ref 4.0–10.5)
nRBC: 0 % (ref 0.0–0.2)

## 2018-09-09 LAB — BASIC METABOLIC PANEL
Anion gap: 4 — ABNORMAL LOW (ref 5–15)
BUN: 30 mg/dL — ABNORMAL HIGH (ref 6–20)
CO2: 25 mmol/L (ref 22–32)
Calcium: 9.1 mg/dL (ref 8.9–10.3)
Chloride: 108 mmol/L (ref 98–111)
Creatinine, Ser: 0.93 mg/dL (ref 0.61–1.24)
GFR calc Af Amer: >60 mL/min (ref >60)
GFR calc non Af Amer: >60 mL/min (ref >60)
Glucose, Bld: 103 mg/dL — ABNORMAL HIGH (ref 70–99)
Potassium: 4.6 mmol/L (ref 3.5–5.1)
Sodium: 137 mmol/L (ref 135–145)

## 2018-09-11 ENCOUNTER — Telehealth: Payer: Self-pay | Admitting: *Deleted

## 2018-09-11 NOTE — Telephone Encounter (Signed)
I did speak with the patient as he had questions regarding bowel prep.  Patient is scheduled for surgery on 09-16-18 with Dr. Dahlia Byes.   The patient is aware to be on clear liquids 2 full days prior to surgery.   Last solid meal should be 3 days prior to surgery.   Patient verbalizes understanding.   The patient was instructed to call the office should he have further questions.

## 2018-09-11 NOTE — Telephone Encounter (Signed)
Notified patient as instructed, patient pleased. Discussed follow-up appointments, patient agrees  

## 2018-09-11 NOTE — Telephone Encounter (Signed)
-----   Message from Jules Husbands, MD sent at 09/11/2018 12:05 PM EST ----- Please let him knwo that his labs are nml ----- Message ----- From: Interface, Lab In [place] Sent: 09/09/2018   9:19 AM EST To: Jules Husbands, MD

## 2018-09-15 MED ORDER — METRONIDAZOLE IN NACL 5-0.79 MG/ML-% IV SOLN
500.0000 mg | INTRAVENOUS | Status: AC
  Administered 2018-09-16: 500 mg via INTRAVENOUS
  Filled 2018-09-15: qty 100

## 2018-09-15 MED ORDER — CIPROFLOXACIN IN D5W 400 MG/200ML IV SOLN
400.0000 mg | INTRAVENOUS | Status: AC
  Administered 2018-09-16 (×2): 400 mg via INTRAVENOUS

## 2018-09-16 ENCOUNTER — Encounter: Payer: Self-pay | Admitting: *Deleted

## 2018-09-16 ENCOUNTER — Other Ambulatory Visit: Payer: Self-pay

## 2018-09-16 ENCOUNTER — Ambulatory Visit: Payer: BC Managed Care – PPO | Admitting: Anesthesiology

## 2018-09-16 ENCOUNTER — Encounter
Admission: RE | Disposition: A | Discharge status: Home / Self Care | Payer: Self-pay | Source: Home / Self Care | Attending: Surgery

## 2018-09-16 ENCOUNTER — Observation Stay
Admission: RE | Admit: 2018-09-16 | Discharge: 2018-09-19 | DRG: 337 | Disposition: A | Discharge status: Home / Self Care | Payer: BC Managed Care – PPO | Attending: Surgery | Admitting: Surgery

## 2018-09-16 DIAGNOSIS — J45909 Unspecified asthma, uncomplicated: Secondary | ICD-10-CM | POA: Insufficient documentation

## 2018-09-16 DIAGNOSIS — K439 Ventral hernia without obstruction or gangrene: Secondary | ICD-10-CM | POA: Insufficient documentation

## 2018-09-16 DIAGNOSIS — K660 Peritoneal adhesions (postprocedural) (postinfection): Secondary | ICD-10-CM | POA: Insufficient documentation

## 2018-09-16 DIAGNOSIS — Z880 Allergy status to penicillin: Secondary | ICD-10-CM | POA: Diagnosis not present

## 2018-09-16 DIAGNOSIS — Z85528 Personal history of other malignant neoplasm of kidney: Secondary | ICD-10-CM | POA: Diagnosis not present

## 2018-09-16 DIAGNOSIS — K435 Parastomal hernia without obstruction or  gangrene: Secondary | ICD-10-CM | POA: Diagnosis not present

## 2018-09-16 DIAGNOSIS — Z433 Encounter for attention to colostomy: Secondary | ICD-10-CM | POA: Diagnosis present

## 2018-09-16 DIAGNOSIS — Z9889 Other specified postprocedural states: Secondary | ICD-10-CM | POA: Diagnosis not present

## 2018-09-16 DIAGNOSIS — K5732 Diverticulitis of large intestine without perforation or abscess without bleeding: Secondary | ICD-10-CM | POA: Insufficient documentation

## 2018-09-16 DIAGNOSIS — Z8719 Personal history of other diseases of the digestive system: Secondary | ICD-10-CM

## 2018-09-16 DIAGNOSIS — K219 Gastro-esophageal reflux disease without esophagitis: Secondary | ICD-10-CM | POA: Insufficient documentation

## 2018-09-16 DIAGNOSIS — Z87891 Personal history of nicotine dependence: Secondary | ICD-10-CM | POA: Insufficient documentation

## 2018-09-16 DIAGNOSIS — K432 Incisional hernia without obstruction or gangrene: Secondary | ICD-10-CM

## 2018-09-16 DIAGNOSIS — Z791 Long term (current) use of non-steroidal anti-inflammatories (NSAID): Secondary | ICD-10-CM | POA: Insufficient documentation

## 2018-09-16 DIAGNOSIS — Z933 Colostomy status: Secondary | ICD-10-CM

## 2018-09-16 HISTORY — PX: INCISIONAL HERNIA REPAIR: SHX193

## 2018-09-16 HISTORY — PX: COLOSTOMY TAKEDOWN: SHX5783

## 2018-09-16 HISTORY — PX: APPENDECTOMY: SHX54

## 2018-09-16 LAB — TYPE AND SCREEN
ABO/RH(D): O POS
Antibody Screen: NEGATIVE

## 2018-09-16 LAB — CBC
HEMATOCRIT: 44 % (ref 39.0–52.0)
Hemoglobin: 14.3 g/dL (ref 13.0–17.0)
MCH: 29.5 pg (ref 26.0–34.0)
MCHC: 32.5 g/dL (ref 30.0–36.0)
MCV: 90.7 fL (ref 80.0–100.0)
Platelets: 157 10*3/uL (ref 150–400)
RBC: 4.85 MIL/uL (ref 4.22–5.81)
RDW: 13.4 % (ref 11.5–15.5)
WBC: 15.3 10*3/uL — ABNORMAL HIGH (ref 4.0–10.5)
nRBC: 0 % (ref 0.0–0.2)

## 2018-09-16 LAB — CREATININE, SERUM
Creatinine, Ser: 1.19 mg/dL (ref 0.61–1.24)
GFR calc Af Amer: >60 mL/min (ref >60)
GFR calc non Af Amer: >60 mL/min (ref >60)

## 2018-09-16 SURGERY — CLOSURE, COLOSTOMY
Anesthesia: General

## 2018-09-16 MED ORDER — HEPARIN SODIUM (PORCINE) 5000 UNIT/ML IJ SOLN
INTRAMUSCULAR | Status: AC
  Administered 2018-09-16: 5000 [IU] via SUBCUTANEOUS
  Filled 2018-09-16: qty 1

## 2018-09-16 MED ORDER — BUPIVACAINE LIPOSOME 1.3 % IJ SUSP: 20.0000 mL | Freq: Once | INTRAMUSCULAR | Status: DC

## 2018-09-16 MED ORDER — SODIUM CHLORIDE (PF) 0.9 % IJ SOLN
INTRAMUSCULAR | Status: AC
  Filled 2018-09-16: qty 50

## 2018-09-16 MED ORDER — FENTANYL CITRATE (PF) 100 MCG/2ML IJ SOLN
25.0000 ug | INTRAMUSCULAR | Status: DC | PRN
  Administered 2018-09-16 (×5): 25 ug via INTRAVENOUS

## 2018-09-16 MED ORDER — ACETAMINOPHEN 500 MG PO TABS
1000.0000 mg | ORAL_TABLET | Freq: Four times a day (QID) | ORAL | Status: DC
  Administered 2018-09-16 – 2018-09-19 (×9): 1000 mg via ORAL
  Filled 2018-09-16 (×11): qty 2

## 2018-09-16 MED ORDER — BUPIVACAINE-EPINEPHRINE 0.25% -1:200000 IJ SOLN
INTRAMUSCULAR | Status: DC | PRN
  Administered 2018-09-16: 30 mL

## 2018-09-16 MED ORDER — ONDANSETRON HCL 4 MG/2ML IJ SOLN: 4.0000 mg | Freq: Once | INTRAMUSCULAR | Status: DC | PRN

## 2018-09-16 MED ORDER — CELECOXIB 200 MG PO CAPS
ORAL_CAPSULE | ORAL | Status: AC
  Administered 2018-09-16: 200 mg via ORAL
  Filled 2018-09-16: qty 1

## 2018-09-16 MED ORDER — PROCHLORPERAZINE EDISYLATE 10 MG/2ML IJ SOLN
5.0000 mg | Freq: Four times a day (QID) | INTRAMUSCULAR | Status: DC | PRN
  Filled 2018-09-16: qty 2

## 2018-09-16 MED ORDER — LIDOCAINE HCL (CARDIAC) PF 100 MG/5ML IV SOSY
PREFILLED_SYRINGE | INTRAVENOUS | Status: DC | PRN
  Administered 2018-09-16: 100 mg via INTRAVENOUS

## 2018-09-16 MED ORDER — KETOROLAC TROMETHAMINE 30 MG/ML IJ SOLN
INTRAMUSCULAR | Status: AC
  Administered 2018-09-16: 30 mg via INTRAVENOUS
  Filled 2018-09-16: qty 1

## 2018-09-16 MED ORDER — MOMETASONE FURO-FORMOTEROL FUM 200-5 MCG/ACT IN AERO
2.0000 | INHALATION_SPRAY | Freq: Two times a day (BID) | RESPIRATORY_TRACT | Status: DC
  Administered 2018-09-16 – 2018-09-19 (×6): 2 via RESPIRATORY_TRACT
  Filled 2018-09-16: qty 8.8

## 2018-09-16 MED ORDER — FENTANYL CITRATE (PF) 100 MCG/2ML IJ SOLN
INTRAMUSCULAR | Status: AC
  Filled 2018-09-16: qty 2

## 2018-09-16 MED ORDER — GABAPENTIN 300 MG PO CAPS
ORAL_CAPSULE | ORAL | Status: AC
  Administered 2018-09-16: 300 mg via ORAL
  Filled 2018-09-16: qty 1

## 2018-09-16 MED ORDER — SODIUM CHLORIDE 0.9 % IV SOLN: INTRAVENOUS | Status: DC | PRN

## 2018-09-16 MED ORDER — ACETAMINOPHEN 500 MG PO TABS
ORAL_TABLET | ORAL | Status: AC
  Administered 2018-09-16: 1000 mg via ORAL
  Filled 2018-09-16: qty 2

## 2018-09-16 MED ORDER — CHLORHEXIDINE GLUCONATE CLOTH 2 % EX PADS: 6.0000 | MEDICATED_PAD | Freq: Once | CUTANEOUS | Status: DC

## 2018-09-16 MED ORDER — CIPROFLOXACIN IN D5W 400 MG/200ML IV SOLN
400.0000 mg | Freq: Once | INTRAVENOUS | Status: AC
  Administered 2018-09-16: 400 mg via INTRAVENOUS
  Filled 2018-09-16: qty 200

## 2018-09-16 MED ORDER — HEPARIN SODIUM (PORCINE) 5000 UNIT/ML IJ SOLN
5000.0000 [IU] | Freq: Once | INTRAMUSCULAR | Status: AC
  Administered 2018-09-16: 5000 [IU] via SUBCUTANEOUS

## 2018-09-16 MED ORDER — FENTANYL CITRATE (PF) 100 MCG/2ML IJ SOLN
INTRAMUSCULAR | Status: DC | PRN
  Administered 2018-09-16 (×2): 50 ug via INTRAVENOUS

## 2018-09-16 MED ORDER — SUCCINYLCHOLINE CHLORIDE 20 MG/ML IJ SOLN
INTRAMUSCULAR | Status: DC | PRN
  Administered 2018-09-16: 100 mg via INTRAVENOUS

## 2018-09-16 MED ORDER — ACETAMINOPHEN 500 MG PO TABS
1000.0000 mg | ORAL_TABLET | ORAL | Status: AC
  Administered 2018-09-16: 1000 mg via ORAL

## 2018-09-16 MED ORDER — CIPROFLOXACIN IN D5W 400 MG/200ML IV SOLN
INTRAVENOUS | Status: AC
  Filled 2018-09-16: qty 200

## 2018-09-16 MED ORDER — GABAPENTIN 400 MG PO CAPS
400.0000 mg | ORAL_CAPSULE | Freq: Three times a day (TID) | ORAL | Status: DC
  Administered 2018-09-16 – 2018-09-19 (×8): 400 mg via ORAL
  Filled 2018-09-16 (×8): qty 1

## 2018-09-16 MED ORDER — FAMOTIDINE 20 MG PO TABS
ORAL_TABLET | ORAL | Status: AC
  Administered 2018-09-16: 20 mg via ORAL
  Filled 2018-09-16: qty 1

## 2018-09-16 MED ORDER — HYDRALAZINE HCL 20 MG/ML IJ SOLN: 10.0000 mg | INTRAMUSCULAR | Status: DC | PRN

## 2018-09-16 MED ORDER — ROCURONIUM BROMIDE 100 MG/10ML IV SOLN
INTRAVENOUS | Status: DC | PRN
  Administered 2018-09-16 (×3): 10 mg via INTRAVENOUS
  Administered 2018-09-16 (×2): 20 mg via INTRAVENOUS
  Administered 2018-09-16: 40 mg via INTRAVENOUS
  Administered 2018-09-16: 20 mg via INTRAVENOUS

## 2018-09-16 MED ORDER — ACETAMINOPHEN 10 MG/ML IV SOLN
INTRAVENOUS | Status: AC
  Filled 2018-09-16: qty 100

## 2018-09-16 MED ORDER — FAMOTIDINE 20 MG PO TABS
20.0000 mg | ORAL_TABLET | Freq: Once | ORAL | Status: AC
  Administered 2018-09-16: 20 mg via ORAL

## 2018-09-16 MED ORDER — ONDANSETRON HCL 4 MG/2ML IJ SOLN: 4.0000 mg | Freq: Four times a day (QID) | INTRAMUSCULAR | Status: DC | PRN

## 2018-09-16 MED ORDER — MORPHINE SULFATE (PF) 2 MG/ML IV SOLN
2.0000 mg | INTRAVENOUS | Status: DC | PRN
  Administered 2018-09-16 – 2018-09-17 (×2): 2 mg via INTRAVENOUS
  Filled 2018-09-16 (×2): qty 1

## 2018-09-16 MED ORDER — MIDAZOLAM HCL 2 MG/2ML IJ SOLN
INTRAMUSCULAR | Status: DC | PRN
  Administered 2018-09-16: 2 mg via INTRAVENOUS

## 2018-09-16 MED ORDER — LACTATED RINGERS IV SOLN
INTRAVENOUS | Status: DC
  Administered 2018-09-16 (×4): via INTRAVENOUS

## 2018-09-16 MED ORDER — KETOROLAC TROMETHAMINE 30 MG/ML IJ SOLN
30.0000 mg | Freq: Four times a day (QID) | INTRAMUSCULAR | Status: DC
  Administered 2018-09-16 – 2018-09-19 (×11): 30 mg via INTRAVENOUS
  Filled 2018-09-16 (×11): qty 1

## 2018-09-16 MED ORDER — DEXTROSE IN LACTATED RINGERS 5 % IV SOLN
INTRAVENOUS | Status: DC
  Administered 2018-09-16 – 2018-09-17 (×2): via INTRAVENOUS

## 2018-09-16 MED ORDER — CELECOXIB 200 MG PO CAPS
200.0000 mg | ORAL_CAPSULE | ORAL | Status: AC
  Administered 2018-09-16: 200 mg via ORAL

## 2018-09-16 MED ORDER — BUPIVACAINE-EPINEPHRINE (PF) 0.25% -1:200000 IJ SOLN
INTRAMUSCULAR | Status: AC
  Filled 2018-09-16: qty 30

## 2018-09-16 MED ORDER — FENTANYL CITRATE (PF) 100 MCG/2ML IJ SOLN
INTRAMUSCULAR | Status: AC
  Administered 2018-09-16: 25 ug via INTRAVENOUS
  Filled 2018-09-16: qty 2

## 2018-09-16 MED ORDER — METRONIDAZOLE IN NACL 5-0.79 MG/ML-% IV SOLN
500.0000 mg | Freq: Once | INTRAVENOUS | Status: AC
  Administered 2018-09-16: 500 mg via INTRAVENOUS
  Filled 2018-09-16: qty 100

## 2018-09-16 MED ORDER — PROCHLORPERAZINE MALEATE 10 MG PO TABS
10.0000 mg | ORAL_TABLET | Freq: Four times a day (QID) | ORAL | Status: DC | PRN
  Filled 2018-09-16: qty 1

## 2018-09-16 MED ORDER — BUPIVACAINE LIPOSOME 1.3 % IJ SUSP
INTRAMUSCULAR | Status: AC
  Filled 2018-09-16: qty 20

## 2018-09-16 MED ORDER — MIDAZOLAM HCL 2 MG/2ML IJ SOLN
INTRAMUSCULAR | Status: AC
  Filled 2018-09-16: qty 2

## 2018-09-16 MED ORDER — ROCURONIUM BROMIDE 50 MG/5ML IV SOLN
INTRAVENOUS | Status: AC
  Filled 2018-09-16: qty 1

## 2018-09-16 MED ORDER — LACTATED RINGERS IV SOLN
INTRAVENOUS | Status: DC | PRN
  Administered 2018-09-16 (×2): via INTRAVENOUS

## 2018-09-16 MED ORDER — ONDANSETRON 4 MG PO TBDP: 4.0000 mg | ORAL_TABLET | Freq: Four times a day (QID) | ORAL | Status: DC | PRN

## 2018-09-16 MED ORDER — OXYCODONE HCL 5 MG PO TABS
5.0000 mg | ORAL_TABLET | ORAL | Status: DC | PRN
  Administered 2018-09-17 – 2018-09-18 (×4): 10 mg via ORAL
  Filled 2018-09-16 (×4): qty 2

## 2018-09-16 MED ORDER — ALBUTEROL SULFATE (2.5 MG/3ML) 0.083% IN NEBU: 2.5000 mg | INHALATION_SOLUTION | RESPIRATORY_TRACT | Status: DC | PRN

## 2018-09-16 MED ORDER — SUGAMMADEX SODIUM 200 MG/2ML IV SOLN
INTRAVENOUS | Status: DC | PRN
  Administered 2018-09-16: 180 mg via INTRAVENOUS

## 2018-09-16 MED ORDER — SEVOFLURANE IN SOLN
RESPIRATORY_TRACT | Status: AC
  Filled 2018-09-16: qty 250

## 2018-09-16 MED ORDER — GABAPENTIN 800 MG PO TABS
400.0000 mg | ORAL_TABLET | Freq: Three times a day (TID) | ORAL | Status: DC
  Filled 2018-09-16 (×3): qty 0.5

## 2018-09-16 MED ORDER — PHENYLEPHRINE HCL 10 MG/ML IJ SOLN
INTRAMUSCULAR | Status: DC | PRN
  Administered 2018-09-16 (×5): 100 ug via INTRAVENOUS

## 2018-09-16 MED ORDER — ENOXAPARIN SODIUM 30 MG/0.3ML ~~LOC~~ SOLN
30.0000 mg | SUBCUTANEOUS | Status: DC
  Administered 2018-09-17 – 2018-09-19 (×3): 30 mg via SUBCUTANEOUS
  Filled 2018-09-16 (×3): qty 0.3

## 2018-09-16 MED ORDER — ONDANSETRON HCL 4 MG/2ML IJ SOLN
INTRAMUSCULAR | Status: DC | PRN
  Administered 2018-09-16: 4 mg via INTRAVENOUS

## 2018-09-16 MED ORDER — ACETAMINOPHEN 10 MG/ML IV SOLN
INTRAVENOUS | Status: DC | PRN
  Administered 2018-09-16: 1000 mg via INTRAVENOUS

## 2018-09-16 MED ORDER — PANTOPRAZOLE SODIUM 40 MG IV SOLR
40.0000 mg | Freq: Every day | INTRAVENOUS | Status: DC
  Administered 2018-09-16 – 2018-09-17 (×2): 40 mg via INTRAVENOUS
  Filled 2018-09-16 (×3): qty 40

## 2018-09-16 MED ORDER — METHOCARBAMOL 500 MG PO TABS
500.0000 mg | ORAL_TABLET | Freq: Four times a day (QID) | ORAL | Status: DC | PRN
  Filled 2018-09-16: qty 1

## 2018-09-16 MED ORDER — DEXAMETHASONE SODIUM PHOSPHATE 10 MG/ML IJ SOLN
INTRAMUSCULAR | Status: DC | PRN
  Administered 2018-09-16: 10 mg via INTRAVENOUS

## 2018-09-16 MED ORDER — PROPOFOL 10 MG/ML IV BOLUS
INTRAVENOUS | Status: DC | PRN
  Administered 2018-09-16: 150 mg via INTRAVENOUS

## 2018-09-16 MED ORDER — GABAPENTIN 300 MG PO CAPS
300.0000 mg | ORAL_CAPSULE | ORAL | Status: AC
  Administered 2018-09-16: 300 mg via ORAL

## 2018-09-16 MED ORDER — CYCLOBENZAPRINE HCL 10 MG PO TABS
5.0000 mg | ORAL_TABLET | Freq: Three times a day (TID) | ORAL | Status: DC
  Administered 2018-09-16 – 2018-09-19 (×8): 5 mg via ORAL
  Filled 2018-09-16 (×8): qty 1

## 2018-09-16 MED ORDER — SODIUM CHLORIDE 0.9 % IV SOLN
INTRAVENOUS | Status: DC | PRN
  Administered 2018-09-16: 60 mL

## 2018-09-16 SURGICAL SUPPLY — 57 items
APPLIER CLIP 11 MED OPEN (CLIP)
APPLIER CLIP 13 LRG OPEN (CLIP)
BLADE CLIPPER SURG (BLADE) ×4 IMPLANT
BNDG CONFORM 2 STRL LF (GAUZE/BANDAGES/DRESSINGS) IMPLANT
BOOT SUTURE AID YELLOW STND (SUTURE) ×4 IMPLANT
BRUSH SCRUB EZ  4% CHG (MISCELLANEOUS) ×2
BRUSH SCRUB EZ 4% CHG (MISCELLANEOUS) ×2 IMPLANT
BULB RESERV EVAC DRAIN JP 100C (MISCELLANEOUS) ×4 IMPLANT
CATH URET ROBINSON 16FR STRL (CATHETERS) ×4 IMPLANT
CHLORAPREP W/TINT 26ML (MISCELLANEOUS) ×4 IMPLANT
CLIP APPLIE 11 MED OPEN (CLIP) IMPLANT
CLIP APPLIE 13 LRG OPEN (CLIP) IMPLANT
COVER CLAMP SIL LG PBX B (MISCELLANEOUS) ×4 IMPLANT
COVER WAND RF STERILE (DRAPES) ×4 IMPLANT
DRAIN CHANNEL JP 15F RND 16 (MISCELLANEOUS) ×8 IMPLANT
DRAPE LAPAROTOMY 100X77 ABD (DRAPES) ×4 IMPLANT
DRSG OPSITE POSTOP 4X6 (GAUZE/BANDAGES/DRESSINGS) ×4 IMPLANT
DRSG TELFA 3X8 NADH (GAUZE/BANDAGES/DRESSINGS) ×4 IMPLANT
DRSG TELFA 4X3 1S NADH ST (GAUZE/BANDAGES/DRESSINGS) ×4 IMPLANT
ELECT BLADE 6.5 EXT (BLADE) ×4 IMPLANT
ELECT CAUTERY BLADE 6.4 (BLADE) ×4 IMPLANT
ELECT REM PT RETURN 9FT ADLT (ELECTROSURGICAL) ×4
ELECTRODE REM PT RTRN 9FT ADLT (ELECTROSURGICAL) ×2 IMPLANT
GAUZE SPONGE 4X4 12PLY STRL (GAUZE/BANDAGES/DRESSINGS) ×4 IMPLANT
GLOVE BIO SURGEON STRL SZ7 (GLOVE) ×40 IMPLANT
GOWN STRL REUS W/ TWL LRG LVL3 (GOWN DISPOSABLE) ×12 IMPLANT
GOWN STRL REUS W/TWL LRG LVL3 (GOWN DISPOSABLE) ×12
HANDLE SUCTION POOLE (INSTRUMENTS) ×2 IMPLANT
LIGASURE IMPACT 36 18CM CVD LR (INSTRUMENTS) ×8 IMPLANT
MESH PHASIX ST 15X20 (Mesh General) ×4 IMPLANT
MESH PHASIX ST 20CMX25CM (Tissue Mesh) ×4 IMPLANT
NEEDLE HYPO 22GX1.5 SAFETY (NEEDLE) ×4 IMPLANT
NS IRRIG 1000ML POUR BTL (IV SOLUTION) ×4 IMPLANT
PACK BASIN MAJOR ARMC (MISCELLANEOUS) ×4 IMPLANT
PACK COLON CLEAN CLOSURE (MISCELLANEOUS) ×4 IMPLANT
PLEDGET CV PTFE 7X3 (MISCELLANEOUS) IMPLANT
RELOAD PROXIMATE 75MM BLUE (ENDOMECHANICALS) IMPLANT
SPONGE KITTNER 5P (MISCELLANEOUS) ×4 IMPLANT
SPONGE LAP 18X18 RF (DISPOSABLE) ×16 IMPLANT
SPONGE LAP 18X36 RFD (DISPOSABLE) ×4 IMPLANT
STAPLER PROXIMATE 55 BLUE (STAPLE) ×4 IMPLANT
STAPLER PROXIMATE 75MM BLUE (STAPLE) IMPLANT
STAPLER SKIN PROX 35W (STAPLE) ×4 IMPLANT
SUCTION POOLE HANDLE (INSTRUMENTS) ×4
SUT ETHILON 3-0 FS-10 30 BLK (SUTURE) ×8
SUT PDS II 3-0 (SUTURE) ×8 IMPLANT
SUT PDS PLUS 0 (SUTURE) ×8
SUT PDS PLUS AB 0 CT-2 (SUTURE) ×8 IMPLANT
SUT SILK 2 0 SH CR/8 (SUTURE) ×4 IMPLANT
SUT SILK 2 0SH CR/8 30 (SUTURE) ×8 IMPLANT
SUT VIC AB 2-0 SH 27 (SUTURE) ×8
SUT VIC AB 2-0 SH 27XBRD (SUTURE) ×8 IMPLANT
SUTURE EHLN 3-0 FS-10 30 BLK (SUTURE) ×4 IMPLANT
SYR 20CC LL (SYRINGE) ×8 IMPLANT
SYR BULB IRRIG 60ML STRL (SYRINGE) ×4 IMPLANT
SYRINGE IRR TOOMEY STRL 70CC (SYRINGE) ×4 IMPLANT
TRAY FOLEY MTR SLVR 16FR STAT (SET/KITS/TRAYS/PACK) ×4 IMPLANT

## 2018-09-16 NOTE — Anesthesia Preprocedure Evaluation (Addendum)
Anesthesia Evaluation  Patient identified by MRN, date of birth, ID band Patient awake    Reviewed: Allergy & Precautions, H&P , NPO status , Patient's Chart, lab work & pertinent test results  History of Anesthesia Complications Negative for: history of anesthetic complications  Airway Mallampati: III  TM Distance: <3 FB Neck ROM: full    Dental  (+) Chipped, Poor Dentition, Missing, Upper Dentures, Partial Lower   Pulmonary neg shortness of breath, asthma , former smoker,           Cardiovascular Exercise Tolerance: Good (-) angina(-) Past MI and (-) DOE negative cardio ROS       Neuro/Psych negative neurological ROS  negative psych ROS   GI/Hepatic Neg liver ROS, GERD  Medicated and Controlled,  Endo/Other  negative endocrine ROS  Renal/GU Renal diseaseHx of right renal cell CA     Musculoskeletal   Abdominal   Peds  Hematology negative hematology ROS (+)   Anesthesia Other Findings Past Medical History: No date: Asthma No date: Diverticulitis  History reviewed. No pertinent surgical history.  BMI    Body Mass Index:  31.75 kg/m      Reproductive/Obstetrics negative OB ROS                            Anesthesia Physical  Anesthesia Plan  ASA: III  Anesthesia Plan: General ETT, Rapid Sequence and Cricoid Pressure   Post-op Pain Management:    Induction: Intravenous  PONV Risk Score and Plan: Ondansetron, Dexamethasone, Midazolam and Treatment may vary due to age or medical condition  Airway Management Planned: Oral ETT  Additional Equipment:   Intra-op Plan:   Post-operative Plan: Extubation in OR and Possible Post-op intubation/ventilation  Informed Consent: I have reviewed the patients History and Physical, chart, labs and discussed the procedure including the risks, benefits and alternatives for the proposed anesthesia with the patient or authorized  representative who has indicated his/her understanding and acceptance.     Dental Advisory Given  Plan Discussed with: Anesthesiologist, CRNA and Surgeon  Anesthesia Plan Comments: (Patient consented for risks of anesthesia including but not limited to:  - adverse reactions to medications - damage to teeth, lips or other oral mucosa - sore throat or hoarseness - Damage to heart, brain, lungs or loss of life  Patient voiced understanding.)        Anesthesia Quick Evaluation

## 2018-09-16 NOTE — Op Note (Addendum)
PROCEDURES: 1. Colostomy takedown with extensive lysis of adhesions 2. Takedown of splenic flexure 3. Open ventral and parastomal hernia repair 4. Bilateral Myocutaneous flaps. Abdominal wall reconstruction with TAR release on the left side and Rives-Stoppa on the Right side. 5. Placement of two pieces of Phasix ST 20x25 and 15x20cms 6. Placement of Prevena WOund vac Measuring 30x3 cms = 90cm2 7. Placements of 15Fr pelvic drain and 15 FR retrorectus drain 8 . Appendectomy   Pre-operative Diagnosis: Hx of perforated diverticulitis s/p Hartmann's and Ventral hernia  Post-operative Diagnosis: same  Surgeon: Jules Husbands   Assistants: Dr. Genevive Bi required due to the complexity of the case, for exposure creation of flaps and anastomosis.  Anesthesia: General endotracheal anesthesia  ASA Class: 2   Surgeon: Caroleen Hamman , MD FACS  Anesthesia: Gen. with endotracheal tube   Findings: Parastomal hernia, Ventral hernia . Widely patent  tension free anastomosis with good perfusion and no evidence of intraoperative leak  Estimated Blood Loss: 100cc                        Complications: none              Condition: stable  Procedure Details  The patient was seen again in the Holding Room. The benefits, complications, treatment options, and expected outcomes were discussed with the patient. The risks of bleeding, infection, recurrence of symptoms, failure to resolve symptoms,  bowel injury, any of which could require further surgery were reviewed with the patient.   The patient was taken to Operating Room, identified as Jonathon Lee and the procedure verified.  A Time Out was held and the above information confirmed.  Prior to the induction of general anesthesia, antibiotic prophylaxis was administered. VTE prophylaxis was in place. General endotracheal anesthesia was then administered and tolerated well. After the induction, the abdomen was prepped with Chloraprep and draped in the sterile  fashion. The patient was positioned in the supine position.  Generous midline laparotomy was performed from xiphoid process all the way to the symphysis pubis.  We incorporated the previous old scar as well as the site for the ventral hernia.  Electrocautery was used to dissect through subcutaneous tissue.  We started on the cranial aspect of the fascia and elevated the fascia between clamps and incised.  The abdominal cavity was entered under direct cessation and we avoided any injuries to the bowel.  We extended our incision all the way down.  And were able to remove the old scar and the debilitated fascia along with the hernia sac.  The hernia measured approximately 7 x 4 cm and was more towards the right side.  And then was turned to the colostomy where elliptical incision was created and electrocautery was used to dissect through the cutaneous tissue until we were able to divide the attachment of the colostomy to the fascia.  Were able to bring the end of the colon to the abdominal cavity.  Continue our extensive lysis of adhesions using a combination of Metzenbaum scissors and electrocautery.  There were some thin adhesions throughout the abdominal wall and there is some thick adhesions within the pelvis.  He identified the rectal stump that was marked with Prolene sutures.  Turned our attention to the splenic flexure we were able to take it down using electrocautery and a combination of the LigaSure device. We Were able to dissect the transverse colon free from the greater omentum to allow an adequate mobilization of  the transverse colon and the splenic flexure. We were Able to create an end to side handsewn anastomosis using a 3-0 PDS in a running fashion.  Were able to parachuted and then completed our anastomosis.  Ecchymosis was tension-free with good perfusion and we did tested for intraoperative leak.  There was no evidence of any bubbles. The appendix was visualized, given the extensive abdominal  surgery and the challenges that may represent re-intervention I decided to perform an appendectomy. Ligasure used to divide te mesoappendix and a standard blue load stapler was used to divide the appendix. Greenwald drain was placed within the pelvis. Tension then was turned to the abdominal wall where the fascia was identified and it was incised.  We identified the edge of the rectus abdominal muscle first on the left side and were able to incise the fascia superiorly and inferiorly respectively.  The flap was created until we identified the perforating vessels to the rectus muscle.  Note that we have a couple of peritoneal rents and were able to close and we also closed the colostomy peritoneal defect using multiple interrupted 2-0 Vicryl's.  Once we identify the transversus abdominal fascia were able to incise it and released that to allow adequate mobilization of the abdominal wall musculature.  We Were able to gain good mobility and we were able to restore continuity of the abdominal wall and restore the midline.  Then we  turned our attention on the right side where again incised the posterior sheath of the rectus fascia deflected the rectus abdominal muscle.  Because we did not want to create a bilateral TAR release I was able to perform a Maureen Chatters release on the right side.  That allowed [place] to place 2 big pieces of mesh in a diamond fashion. Posterior fascia she was reapproximated using a running 0 PDS suture.    Given that this was a clean contaminated case I did not want increase the risk of potential mesh infection therefore I decided to use 2 pieces of phasic's ST mesh.  We attach it with interrupted PDS sutures to the pubic bone and to the sternum.  The two pieces of mesh were tailored and lay flat against the peritoneum.  Very pleased of how this repair went. The 15 French drain was placed in a retrorectus fashion on top of the mesh.  Please note that we did the 2 pieces of mesh with  multiple interrupted 2-0 Vicryl sutures.   Anterior fascia was then closed in a running fashion with a 0 PDS suture.  Liposomal Marcaine was injected on all incision sites under direct visualization.  Subcutaneous space was irrigated with normal saline.  staples were used to close all the skin incisions. Prevena wound vac was tailored to both midline laparotomy and previous colostomy incisions. No evidence of leak.   Needle and laparotomy count were correct and there were no immediate occasions  Caroleen Hamman, MD, FACS

## 2018-09-16 NOTE — H&P (Signed)
Chief Complaint  Patient presents with  . Follow-up    ct scan    HPI: Jonathon Lee following up as part of the preop evaluation for his colostomy takedown.  I have performed a CT scan of the abdomen and pelvis that I have personally review showing no evidence of any parastomal or ventral hernias.  There is however laxity of the abdominal wall lateral to the midline on both sides.  His creatinine is 1.1 and that has increased a little bit from a baseline 0.8.  He is doing otherwise well denies any abdominal pain.  No fevers no chills he is taking p.o. well.  He is now exercising.  From his renal cell carcinoma there is no evidence of recurrence or complications.      Past Medical History:  Diagnosis Date  . Asthma   . Diverticulitis   . 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   . Renal lesion 03/2018         Past Surgical History:  Procedure Laterality Date  . LAPAROTOMY N/A 12/16/2017   Procedure: EXPLORATORY LAPAROTOMY/ SIGMOID COLECTOMY, colostomy;  Surgeon: Jules Husbands, MD;  Location: [facility] ORS;  Service: General;  Laterality: N/A;    History reviewed. No pertinent family history.  Social History:  reports that he has quit smoking. He has never used smokeless tobacco. He reports that he does not drink alcohol or use drugs.  Allergies:      Allergies  Allergen Reactions  . Zosyn [Piperacillin Sod-Tazobactam So] Hives    Medications reviewed.    ROS Full ROS performed and is otherwise negative other than what is stated in HPI   Physical Exam Vitals signs and nursing note reviewed. Exam conducted with a chaperone present.  Constitutional:      Appearance: Normal appearance. He is normal weight.  Eyes:     General:        Right eye: No discharge.        Left eye: No discharge.     Extraocular Movements: Extraocular movements intact.  Neck:     Musculoskeletal: Normal range  of motion and neck supple. No muscular tenderness.  Cardiovascular:     Rate and Rhythm: Normal rate and regular rhythm.     Pulses: Normal pulses.     Heart sounds: No murmur.  Pulmonary:     Effort: Pulmonary effort is normal. No respiratory distress.     Breath sounds: Normal breath sounds. No stridor. No wheezing or rhonchi.  Abdominal:     General: Abdomen is flat. There is no distension.     Palpations: There is no mass.     Tenderness: There is no rebound.     Hernia: No hernia is present.     Comments: Colostomy in place. Some laxity of the abdominal wall c/w diastasis recti  Musculoskeletal: Normal range of motion.  Skin:    General: Skin is warm and dry.     Capillary Refill: Capillary refill takes less than 2 seconds.  Neurological:     General: No focal deficit present.     Mental Status: He is alert.  Psychiatric:        Mood and Affect: Mood normal.        Thought Content: Thought content normal.        Judgment: Judgment normal.         Assessment/Plan:  63 year old male prior Hartman's procedure in need for  colostomy takedown.  Discussed with the patient in detail about the procedure.  Risk benefit and possible complications including but not limited to: Bleeding, infection, hernia, need for ostomy, anastomotic leak, chronic pain and prolonged hospitalization. Pending on the degree of diastases and intraoperative findings I may place a reinforcement of absorbable mesh within the retrorectus space to prevent any large ventral hernias. He Understands and wishes to proceed.  Caroleen Hamman, MD [facility] General Surgeon

## 2018-09-16 NOTE — Anesthesia Post-op Follow-up Note (Signed)
Anesthesia QCDR form completed.        

## 2018-09-16 NOTE — Transfer of Care (Signed)
Immediate Anesthesia Transfer of Care Note  Patient: Jonathon Lee  Procedure(s) Performed: COLOSTOMY TAKEDOWN (N/A ) HERNIA REPAIR INCISIONAL POSSIBLE (N/A ) APPENDECTOMY  Patient Location: PACU  Anesthesia Type:General  Level of Consciousness: awake, alert  and oriented  Airway & Oxygen Therapy: Patient connected to face mask oxygen  Post-op Assessment: Post -op Vital signs reviewed and stable  Post vital signs: stable  Last Vitals:  Vitals Value Taken Time  BP 121/75 09/16/2018  4:36 PM  Temp 36.9 C 09/16/2018  4:36 PM  Pulse 95 09/16/2018  4:37 PM  Resp 25 09/16/2018  4:37 PM  SpO2 100 % 09/16/2018  4:37 PM  Vitals shown include unvalidated device data.  Last Pain:  Vitals:   09/16/18 0932  TempSrc: Temporal  PainSc: 0-No pain         Complications: No apparent anesthesia complications

## 2018-09-16 NOTE — Anesthesia Procedure Notes (Signed)
Procedure Name: Intubation Date/Time: 09/16/2018 11:51 AM Performed by: Aline Brochure, CRNA Pre-anesthesia Checklist: Patient identified, Emergency Drugs available, Suction available and Patient being monitored Patient Re-evaluated:Patient Re-evaluated prior to induction Oxygen Delivery Method: Circle system utilized Preoxygenation: Pre-oxygenation with 100% oxygen Induction Type: IV induction Ventilation: Mask ventilation without difficulty Laryngoscope Size: Mac and 4 Grade View: Grade I Tube type: Oral Tube size: 7.5 mm Number of attempts: 1 Airway Equipment and Method: Stylet Placement Confirmation: ETT inserted through vocal cords under direct vision,  positive ETCO2 and breath sounds checked- equal and bilateral Secured at: 23 cm Tube secured with: Tape Dental Injury: Teeth and Oropharynx as per pre-operative assessment

## 2018-09-17 ENCOUNTER — Encounter: Payer: Self-pay | Admitting: Surgery

## 2018-09-17 DIAGNOSIS — Z433 Encounter for attention to colostomy: Secondary | ICD-10-CM | POA: Diagnosis not present

## 2018-09-17 LAB — CBC
HEMATOCRIT: 40.5 % (ref 39.0–52.0)
Hemoglobin: 13.4 g/dL (ref 13.0–17.0)
MCH: 29.4 pg (ref 26.0–34.0)
MCHC: 33.1 g/dL (ref 30.0–36.0)
MCV: 88.8 fL (ref 80.0–100.0)
Platelets: 148 10*3/uL — ABNORMAL LOW (ref 150–400)
RBC: 4.56 MIL/uL (ref 4.22–5.81)
RDW: 13.5 % (ref 11.5–15.5)
WBC: 12.1 10*3/uL — ABNORMAL HIGH (ref 4.0–10.5)
nRBC: 0 % (ref 0.0–0.2)

## 2018-09-17 LAB — BASIC METABOLIC PANEL
Anion gap: 6 (ref 5–15)
BUN: 16 mg/dL (ref 6–20)
CO2: 22 mmol/L (ref 22–32)
Calcium: 8.1 mg/dL — ABNORMAL LOW (ref 8.9–10.3)
Chloride: 109 mmol/L (ref 98–111)
Creatinine, Ser: 1.12 mg/dL (ref 0.61–1.24)
GFR calc Af Amer: >60 mL/min (ref >60)
GFR calc non Af Amer: >60 mL/min (ref >60)
GLUCOSE: 181 mg/dL — AB (ref 70–99)
Potassium: 4.3 mmol/L (ref 3.5–5.1)
Sodium: 137 mmol/L (ref 135–145)

## 2018-09-17 NOTE — Progress Notes (Signed)
[facility] Day(s): 1.   Post op day(s): 1 Day Post-Op.   Interval History: Patient seen and examined, no acute events or new complaints overnight. Patient reports that he is feeling so much better. No reports of abdominal pain, nausea, or emesis. No reports of flatus currently. JP with 170 ccs out.   Review of Systems:  Constitutional: denies fever, chills  Respiratory: denies any shortness of breath  Cardiovascular: denies chest pain or palpitations  Gastrointestinal: denies abdominal pain, N/V, or diarrhea/and bowel function as per interval history Integumentary: denies any other rashes or skin discolorations except surgical incision  Vital signs in last 24 hours: [min-max] current  Temp:  [97.5 F (36.4 C)-98.9 F (37.2 C)] 97.8 F (36.6 C) (01/22 0440) Pulse Rate:  [75-98] 75 (01/22 0440) Resp:  [12-23] 20 (01/22 0440) BP: (91-125)/(62-92) 91/62 (01/22 0440) SpO2:  [95 %-100 %] 97 % (01/22 0440) Weight:  [87.1 kg] 87.1 kg (01/21 0932)     Height: 5\' 9"  (175.3 cm) Weight: 87.1 kg BMI (Calculated): 28.34   Intake/Output this shift:  No intake/output data recorded.   Intake/Output last 2 shifts:  @IOLAST2SHIFTS @   Physical Exam:  Constitutional: alert, cooperative and no distress  Respiratory: breathing non-labored at rest  Cardiovascular: regular rate and sinus rhythm  Gastrointestinal: soft, non-tender, and non-distended. JP in RLQ draining pelvic region with serosanguinous drainage, JP in LLQ draining retro-rectus space with serosanguinous drainage.  Integumentary: Midline incision and previous colostomy incision with wound vac in place, no evidence of leak    Labs:  CBC Latest Ref Rng & Units 09/17/2018 09/16/2018 09/09/2018  WBC 4.0 - 10.5 K/uL 12.1(H) 15.3(H) 7.8  Hemoglobin 13.0 - 17.0 g/dL 13.4 14.3 15.2  Hematocrit 39.0 - 52.0 % 40.5 44.0 46.4  Platelets 150 - 400 K/uL 148(L) 157 203   CMP Latest Ref Rng & Units  09/17/2018 09/16/2018 09/09/2018  Glucose 70 - 99 mg/dL 181(H) - 103(H)  BUN 6 - 20 mg/dL 16 - 30(H)  Creatinine 0.61 - 1.24 mg/dL 1.12 1.19 0.93  Sodium 135 - 145 mmol/L 137 - 137  Potassium 3.5 - 5.1 mmol/L 4.3 - 4.6  Chloride 98 - 111 mmol/L 109 - 108  CO2 22 - 32 mmol/L 22 - 25  Calcium 8.9 - 10.3 mg/dL 8.1(L) - 9.1  Total Protein 6.5 - 8.1 g/dL - - -  Total Bilirubin 0.3 - 1.2 mg/dL - - -  Alkaline Phos 38 - 126 U/L - - -  AST 15 - 41 U/L - - -  ALT 17 - 63 U/L - - -    Imaging studies: No new pertinent imaging studies   Assessment/Plan:  63 y.o. male who is overall doing well with a likely reactive leukocytosis from surgery who is 1 Day Post-Op s/p colostomy takedown and abdominal wall repair, complicated by pertinent comorbidities including asthma, history of renal cell carcinoma, and obesity.   - Advance to clear liquid diet + IVF   - Pain control (minimize narcotics) and antiemetics as needed   - Continue to monitor abdominal exam and ongoing bowel function  - Discontinue foley catheter  - Morning CBC and BMP  - Continue JP Drain, Wound Vac  - Encouraged Mobilization  - DVT prophylaxis  All of the above findings and recommendations were discussed with the patient, and the medical team, and all of patient's questions were answered to his expressed satisfaction.  -- Edison Simon, PA-C [facility] 09/17/2018, 9:07  AM [phone] M-F: 7am - 4pm

## 2018-09-18 DIAGNOSIS — Z433 Encounter for attention to colostomy: Secondary | ICD-10-CM | POA: Diagnosis not present

## 2018-09-18 LAB — GLUCOSE, CAPILLARY: Glucose-Capillary: 110 mg/dL — ABNORMAL HIGH (ref 70–99)

## 2018-09-18 LAB — SURGICAL PATHOLOGY

## 2018-09-18 MED ORDER — PANTOPRAZOLE SODIUM 40 MG PO TBEC
40.0000 mg | DELAYED_RELEASE_TABLET | Freq: Every day | ORAL | Status: DC
  Administered 2018-09-18: 40 mg via ORAL
  Filled 2018-09-18: qty 1

## 2018-09-18 NOTE — Progress Notes (Signed)
[facility] Hospital Day(s): 2.   Post op day(s): 2 Days Post-Op.   Interval History: Patient seen and examined, no acute events or new complaints overnight. Patient reports that he is not having any abdominal pain, nausea, emesis, or fevers. He has been tolerating a clear liquid diet. Endorses passing flatus. Mobilizing well. No other complaints this morning.    Review of Systems:  Constitutional: denies fever, chills  Respiratory: denies any shortness of breath  Cardiovascular: denies chest pain or palpitations  Gastrointestinal: denies abdominal pain, N/V, or diarrhea/and bowel function as per interval history Integumentary: denies any other rashes or skin discolorations except surgical incision  Vital signs in last 24 hours: [min-max] current  Temp:  [97.4 F (36.3 C)-98.2 F (36.8 C)] 97.4 F (36.3 C) (01/23 0532) Pulse Rate:  [67-79] 67 (01/23 0532) Resp:  [16-18] 18 (01/22 2024) BP: (93-108)/(54-73) 93/54 (01/23 0532) SpO2:  [96 %-99 %] 97 % (01/23 0532)     Height: 5\' 9"  (175.3 cm) Weight: 87.1 kg BMI (Calculated): 28.34   Intake/Output this shift:  No intake/output data recorded.   Intake/Output last 2 shifts:  @IOLAST2SHIFTS @   Physical Exam:  Constitutional: alert, cooperative and no distress  Respiratory: breathing non-labored at rest  Cardiovascular: regular rate and sinus rhythm  Gastrointestinal: soft, non-tender, and non-distended. JP in RLQ draining pelvic region with serosanguinous drainage, JP in LLQ draining retro-rectus space with serosanguinous drainage.  Integumentary: Midline incision and previous colostomy incision with wound vac in place, no evidence of leak    Labs:  CBC Latest Ref Rng & Units 09/17/2018 09/16/2018 09/09/2018  WBC 4.0 - 10.5 K/uL 12.1(H) 15.3(H) 7.8  Hemoglobin 13.0 - 17.0 g/dL 13.4 14.3 15.2  Hematocrit 39.0 - 52.0 % 40.5 44.0 46.4  Platelets 150 - 400 K/uL 148(L) 157 203   CMP Latest Ref  Rng & Units 09/17/2018 09/16/2018 09/09/2018  Glucose 70 - 99 mg/dL 181(H) - 103(H)  BUN 6 - 20 mg/dL 16 - 30(H)  Creatinine 0.61 - 1.24 mg/dL 1.12 1.19 0.93  Sodium 135 - 145 mmol/L 137 - 137  Potassium 3.5 - 5.1 mmol/L 4.3 - 4.6  Chloride 98 - 111 mmol/L 109 - 108  CO2 22 - 32 mmol/L 22 - 25  Calcium 8.9 - 10.3 mg/dL 8.1(L) - 9.1  Total Protein 6.5 - 8.1 g/dL - - -  Total Bilirubin 0.3 - 1.2 mg/dL - - -  Alkaline Phos 38 - 126 U/L - - -  AST 15 - 41 U/L - - -  ALT 17 - 63 U/L - - -    Imaging studies: No new pertinent imaging studies   Assessment/Plan:  63 y.o. male who is overall doing 2 Days Post-Op s/p colostomy takedown and abdominal wall repair, complicated by pertinent comorbidities including asthma, history of renal cell carcinoma, and obesity.              - Advance to full liquid diet + discontinue IVF              - Pain control (minimize narcotics) and antiemetics as needed              - Continue to monitor abdominal exam and ongoing bowel function  - Monitor platelets as needed             - Continue JP Drain, Wound Vac             - Encouraged Mobilization             -  DVT prophylaxis  All of the above findings and recommendations were discussed with the patient, and the medical team, and all of patient's questions were answered to his expressed satisfaction.  -- Edison Simon, PA-C [facility] 09/18/2018, 7:47 AM [phone] M-F: 7am - 4pm

## 2018-09-18 NOTE — Anesthesia Postprocedure Evaluation (Signed)
Anesthesia Post Note  Patient: Jonathon Lee  Procedure(s) Performed: COLOSTOMY TAKEDOWN (N/A ) HERNIA REPAIR INCISIONAL POSSIBLE (N/A ) APPENDECTOMY  Patient location during evaluation: PACU Anesthesia Type: General Level of consciousness: awake and alert Pain management: pain level controlled Vital Signs Assessment: post-procedure vital signs reviewed and stable Respiratory status: spontaneous breathing, nonlabored ventilation and respiratory function stable Cardiovascular status: blood pressure returned to baseline and stable Postop Assessment: no apparent nausea or vomiting Anesthetic complications: no     Last Vitals:  Vitals:   09/17/18 2024 09/18/18 0532  BP: 108/73 (!) 93/54  Pulse: 70 67  Resp: 18   Temp: (!) 36.4 C (!) 36.3 C  SpO2: 99% 97%    Last Pain:  Vitals:   09/18/18 0532  TempSrc: Oral  PainSc:                  Durenda Hurt

## 2018-09-19 DIAGNOSIS — Z433 Encounter for attention to colostomy: Secondary | ICD-10-CM | POA: Diagnosis not present

## 2018-09-19 LAB — CBC
HCT: 34.2 % — ABNORMAL LOW (ref 39.0–52.0)
Hemoglobin: 11.1 g/dL — ABNORMAL LOW (ref 13.0–17.0)
MCH: 29.3 pg (ref 26.0–34.0)
MCHC: 32.5 g/dL (ref 30.0–36.0)
MCV: 90.2 fL (ref 80.0–100.0)
NRBC: 0 % (ref 0.0–0.2)
Platelets: 145 10*3/uL — ABNORMAL LOW (ref 150–400)
RBC: 3.79 MIL/uL — ABNORMAL LOW (ref 4.22–5.81)
RDW: 13.9 % (ref 11.5–15.5)
WBC: 7.3 10*3/uL (ref 4.0–10.5)

## 2018-09-19 LAB — BASIC METABOLIC PANEL
Anion gap: 5 (ref 5–15)
BUN: 18 mg/dL (ref 6–20)
CO2: 26 mmol/L (ref 22–32)
Calcium: 7.9 mg/dL — ABNORMAL LOW (ref 8.9–10.3)
Chloride: 110 mmol/L (ref 98–111)
Creatinine, Ser: 0.96 mg/dL (ref 0.61–1.24)
GFR calc Af Amer: >60 mL/min (ref >60)
GFR calc non Af Amer: >60 mL/min (ref >60)
Glucose, Bld: 101 mg/dL — ABNORMAL HIGH (ref 70–99)
Potassium: 3.6 mmol/L (ref 3.5–5.1)
Sodium: 141 mmol/L (ref 135–145)

## 2018-09-19 MED ORDER — OXYCODONE HCL 5 MG PO TABS: 5.0000 mg | ORAL_TABLET | ORAL | 0 refills | Status: DC | PRN

## 2018-09-19 NOTE — Progress Notes (Signed)
R JP drain removed per MD order.

## 2018-09-19 NOTE — Care Management (Signed)
Patient to discharge with Prevena Vac in place.  Patient is mobile independently.  Per nursing no RNCM needs

## 2018-09-19 NOTE — Discharge Summary (Signed)
[facility] SURGICAL DISCHARGE SUMMARY  Patient ID: Ewin Rehberg MRN: [id number] DOB/AGE: 61-Aug-1957 61 y.o.  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 Colostomy takedown with extensive lysis of adhesions 2. Takedown of splenic flexure 3. Open ventral and parastomal hernia repair 4. Bilateral Myocutaneous flaps. Abdominal wall reconstruction with TAR release on the left side and Rives-Stoppa on the Right side. 5. Placement of two pieces of Phasix ST 20x25 and 15x20cms 6. Placement of Prevena WOund vac Measuring 30x3 cms = 90cm2 7. Placements of 15Fr pelvic drain and 15 FR retrorectus drain 8 . Appendectomy   HPI: Jonathon Lee is a 63 y.o. male with a history of perforated diverticulitis s/p Hartmans procedure who presents to [facility] on 09/16/2018 for colostomy reversal and the above procedures with Dr Dahlia Byes.   Hospital Course: .Informed consent was obtained and documented, and patient underwent uneventful colostomy reversal, appendectomy, and abdominal wall reconstruction (Dr Dahlia Byes, 09/16/2018).  Post-operatively, patient's pain improved/resolved and advancement of patient's diet and ambulation were well-tolerated. The remainder of patient's hospital course was essentially unremarkable, and discharge planning was initiated accordingly with patient safely able to be discharged home with appropriate discharge instructions, pain control, and outpatient follow-up after all of his  questions were answered to his expressed satisfaction.  Discharge Condition: Good   Physical Examination:  Constitutional: alert, cooperative and no distress  Respiratory: breathing non-labored at rest  Cardiovascular: regular rate and sinus rhythm  Gastrointestinal: soft, non-tender, and non-distended. JP in RLQ draining pelvic region with serosanguinous  drainage, JP in LLQ draining retro-rectus space with serosanguinous drainage.  Integumentary: Midline incision and previous colostomy incision with wound vac in place, no evidence of leak    Allergies as of 09/19/2018      Reactions   Zosyn [piperacillin Sod-tazobactam So] Hives      Medication List    TAKE these medications   ADVAIR DISKUS 250-50 MCG/DOSE Aepb Generic drug:  Fluticasone-Salmeterol Inhale 1 puff into the lungs 2 (two) times daily as needed (for shortness of breath or wheezing).   albuterol 108 (90 Base) MCG/ACT inhaler Commonly known as:  PROVENTIL HFA;VENTOLIN HFA Inhale 2 puffs into the lungs every 4 (four) hours as needed for wheezing or shortness of breath.   amitriptyline 50 MG tablet Commonly known as:  ELAVIL Take 50 mg by mouth at bedtime.   bisacodyl 5 MG EC tablet Commonly known as:  bisacodyl Take 4 tablets (5 mg each) at 8 am the day before surgery. What changed:    how much to take  how to take this  when to take this  additional instructions   erythromycin base 500 MG tablet Commonly known as:  E-MYCIN Take two tablets at 8 am, two tablets at 2 pm, and two tablets at 8 pm the day before surgery.   ibuprofen 600 MG tablet Commonly known as:  ADVIL,MOTRIN Take 1 tablet (600 mg total) by mouth every 8 (eight) hours as needed.   neomycin 500 MG tablet Commonly known as:  MYCIFRADIN Take two tablets at 8 am, two tablets at 2 pm, and two tablets at 8 pm the day before surgery.   oxyCODONE 5 MG immediate release tablet Commonly known as:  Oxy IR/ROXICODONE Take 1 tablet (5 mg total) by mouth every 4 (four) hours as needed for severe pain or breakthrough pain.   polyethylene glycol powder powder Commonly known as:  GLYCOLAX/MIRALAX 255 grams one bottle for bowel prep What changed:    how much to take  how to take this  when to take this  additional instructions   Vitamin D 125 MCG (5000 UT) Caps Take 5,000 Units by mouth  daily.        Follow-up Information    Pabon, [place] F, MD. Schedule an appointment as soon as possible for a visit on 09/24/2018.   Specialty:  General Surgery Why:  s/p colostomy reversal and abdominal wall reconstruction Contact information: [address] [place] [address] [phone]            -- Zachary Schulz , PA-C [facility]  09/19/2018, 9:30 AM [phone] M-F: 7am - 4pm

## 2018-09-24 ENCOUNTER — Encounter: Payer: Self-pay | Admitting: Surgery

## 2018-09-24 ENCOUNTER — Other Ambulatory Visit: Payer: Self-pay

## 2018-09-24 ENCOUNTER — Ambulatory Visit (INDEPENDENT_AMBULATORY_CARE_PROVIDER_SITE_OTHER): Payer: BC Managed Care – PPO | Admitting: Surgery

## 2018-09-24 VITALS — BP 106/70 | HR 101 | Temp 97.7°F | Resp 16 | Ht 69.0 in | Wt 201.4 lb

## 2018-09-24 DIAGNOSIS — Z09 Encounter for follow-up examination after completed treatment for conditions other than malignant neoplasm: Secondary | ICD-10-CM

## 2018-09-24 NOTE — Progress Notes (Signed)
S/p abd wall reconstruction and colostomy take down Taking PO, no N/V. No fevers + flatus and BM JP about 50-75cc / day  PE NAD Abd: prevena vac removed. Incision healing well, no infection, no peritonitis. Drain serous fluid   A/p Doing well Daily dry dressing to midline wound RTC next week for staple removal and drain removal No clinical evidence of complications

## 2018-09-24 NOTE — Patient Instructions (Addendum)
The patient is aware to call back for any questions or concerns.  Change gauze dressing as needed

## 2018-09-25 ENCOUNTER — Telehealth: Payer: Self-pay | Admitting: Surgery

## 2018-09-25 NOTE — Telephone Encounter (Signed)
Patient is calling and is asking if Dr. Dahlia Lee would call him in something for some pain medication, said his pain level is at a five right now. Please call patient and advise.      Patient also has questions about insurance angie would you call the patient and advise.

## 2018-09-25 NOTE — Telephone Encounter (Signed)
Patient states he is experiencing some pain. Wound vac was removed yesterday.  He is washing the wound and keeping it clean.  We discussed trying Tylenol and ibuprofen together to see if this helps with the discomfort and pain he is experiencing. He will call office if the regimen above does not help.

## 2018-10-01 ENCOUNTER — Other Ambulatory Visit: Payer: Self-pay

## 2018-10-01 ENCOUNTER — Ambulatory Visit (INDEPENDENT_AMBULATORY_CARE_PROVIDER_SITE_OTHER): Payer: BC Managed Care – PPO | Admitting: Surgery

## 2018-10-01 ENCOUNTER — Encounter: Payer: Self-pay | Admitting: Surgery

## 2018-10-01 VITALS — BP 106/73 | HR 98 | Temp 97.7°F | Wt 192.8 lb

## 2018-10-01 DIAGNOSIS — Z09 Encounter for follow-up examination after completed treatment for conditions other than malignant neoplasm: Secondary | ICD-10-CM

## 2018-10-01 MED ORDER — AMOXICILLIN-POT CLAVULANATE 875-125 MG PO TABS: 1.0000 | ORAL_TABLET | Freq: Two times a day (BID) | ORAL | 0 refills | Status: AC

## 2018-10-01 MED ORDER — OXYCODONE HCL 5 MG PO TABS: 5.0000 mg | ORAL_TABLET | ORAL | 0 refills | Status: AC | PRN

## 2018-10-01 NOTE — Patient Instructions (Signed)
Patient will return to the office in 2 weeks.   Call the office with any questions or concerns.

## 2018-10-01 NOTE — Progress Notes (Signed)
S/p abd wall reconstruction and ostomy takedwon Having some int. abd pain, slowly improving Taking Po, having Bm No fevers or chills No N/V Less 30cc day from drain  PE NAD Abd: soft, minimal appropriate incisional tenderness. JP removed. Insertion site w cellulitis and tender. Staples removed   A/P doing well Since JP site has cellulitis we will prescribe Augmentin for 1 week NO surgical complications RTC 2 weeks for final wound check

## 2018-10-15 ENCOUNTER — Encounter: Payer: Self-pay | Admitting: Surgery

## 2018-10-15 ENCOUNTER — Other Ambulatory Visit: Payer: Self-pay

## 2018-10-15 ENCOUNTER — Ambulatory Visit (INDEPENDENT_AMBULATORY_CARE_PROVIDER_SITE_OTHER): Payer: BC Managed Care – PPO | Admitting: Surgery

## 2018-10-15 VITALS — BP 118/75 | HR 80 | Temp 97.7°F | Resp 20 | Ht 69.0 in | Wt 197.0 lb

## 2018-10-15 DIAGNOSIS — Z09 Encounter for follow-up examination after completed treatment for conditions other than malignant neoplasm: Secondary | ICD-10-CM

## 2018-10-15 NOTE — Patient Instructions (Signed)
The patient is aware to call back for any questions or new concerns.  

## 2018-10-15 NOTE — Progress Notes (Signed)
S/p colostomy takedown with abdominal  wall reconstruction using Phasix ST mesh on 09/16/18 Has Done extremely well.  He does not have any drains.  He is taking p.o. and having normal bowel movements.  He feels "great" Fevers no chills and no complaints  PE NAD, alert Abd: soft, incision completely healed, no infection, seroma or recurrence. No tenderness  A/p Doing very well w/o complications HE is very happy with the outcome F/U prn

## 2020-01-22 IMAGING — MR MR ABDOMEN WO/W CM
10 of 17 series · 28 of 48 positions shown · IV contrast (19mL MULTIHANCE)
Comparison: 12/26/2016

CLINICAL DATA: Indeterminate right renal lesion on CT for further
characterization.

EXAM:
MRI ABDOMEN WITHOUT AND WITH CONTRAST
TECHNIQUE: Multiplanar multisequence MR imaging of the abdomen was performed
both before and after the administration of intravenous contrast.
CONTRAST:  19mL MULTIHANCE GADOBENATE DIMEGLUMINE 529 MG/ML IV SOLN

[Series 2: T2 · coronal · 8.0mm · 0.78mm/px · 2 of 19 slices shown]
[im 1/19]
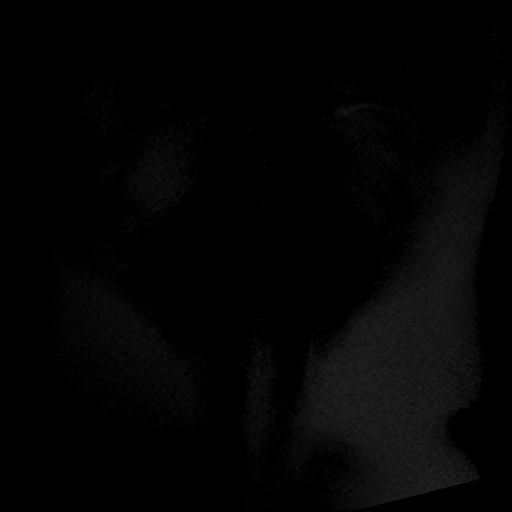
[im 19/19]
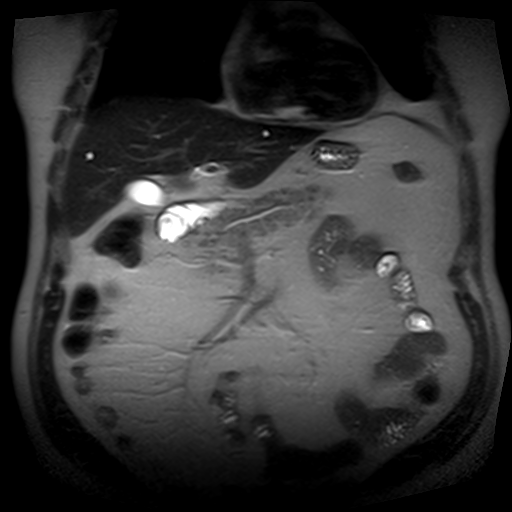

[Series 3: T2 fat-sat · axial · 7.0mm · 0.74mm/px · z∈[-83,+101]mm · 2 of 23 slices shown]
[im 1/23]
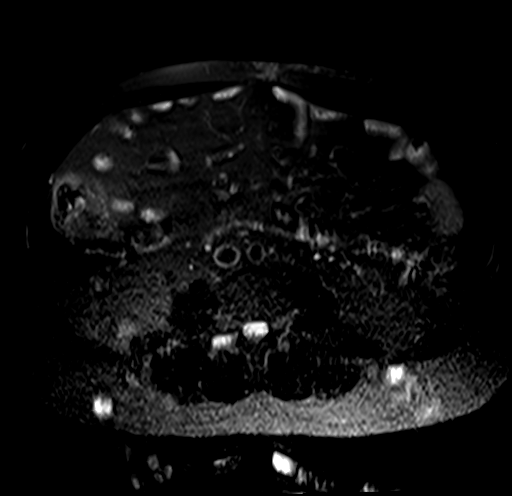
[im 23/23]
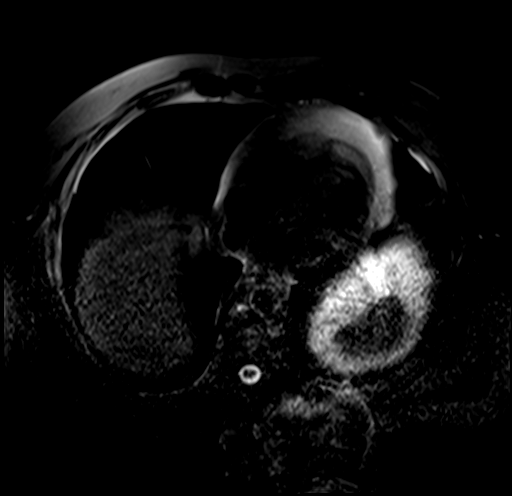

[Series 4: T1 · axial · 7.0mm · 0.74mm/px · z∈[-83,+101]mm · 3 of 46 slices shown]
[im 1/46]
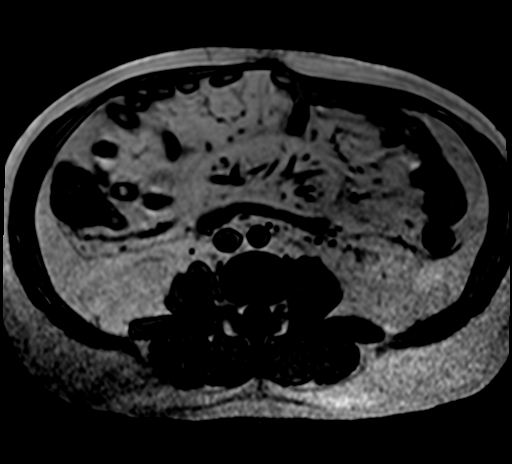
[im 23/46]
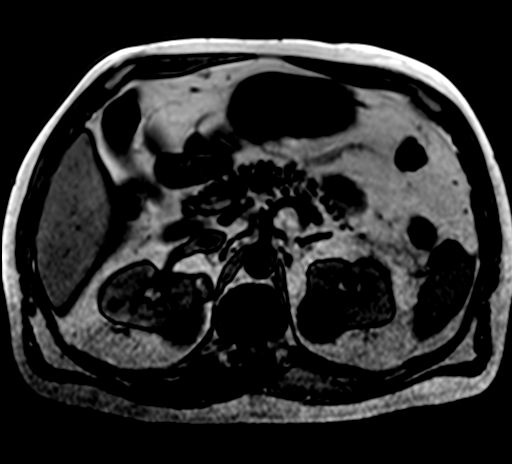
[im 46/46]
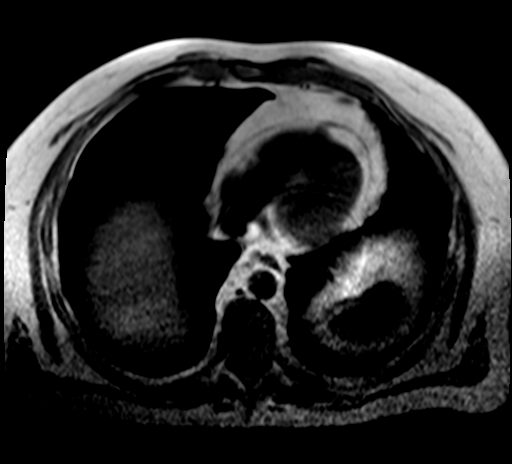

[Series 6: DWI · axial · 6.0mm · 1.98mm/px · z∈[-95,+113]mm · 5 of 90 slices shown]
[im 1/90]
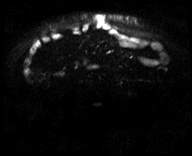
[im 23/90]
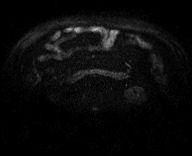
[im 45/90]
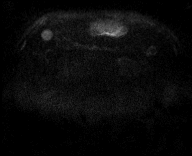
[im 67/90]
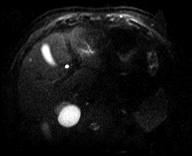
[im 90/90]
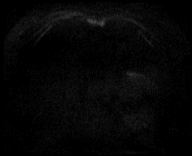

[Series 7: ax dwi_adc · axial · 6.0mm · 1.98mm/px · z∈[-95,+113]mm · 2 of 30 slices shown]
[im 1/30]
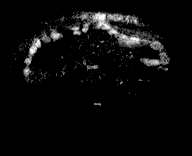
[im 30/30]
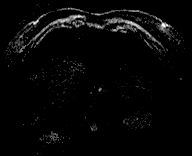

[Series 8: bSSFP · axial · 4.5mm · 0.74mm/px · z∈[-87,+111]mm · 2 of 45 slices shown]
[im 1/45]
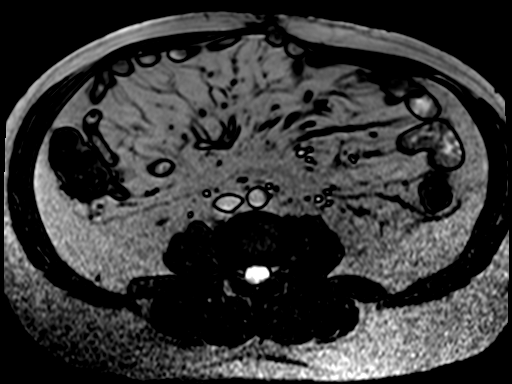
[im 45/45]
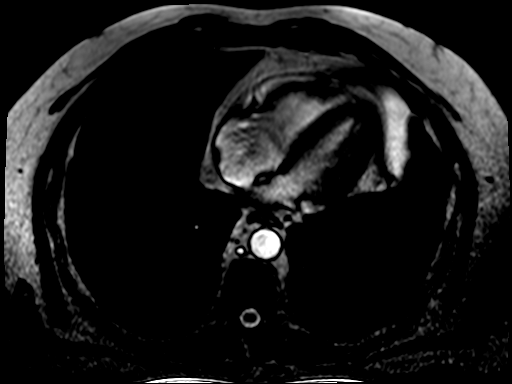

[Series 10: T1 dynamic fat-sat · axial · non-contrast · 3.0mm · 0.74mm/px · z∈[-85,+104]mm · 3 of 64 slices shown]
[im 1/64]
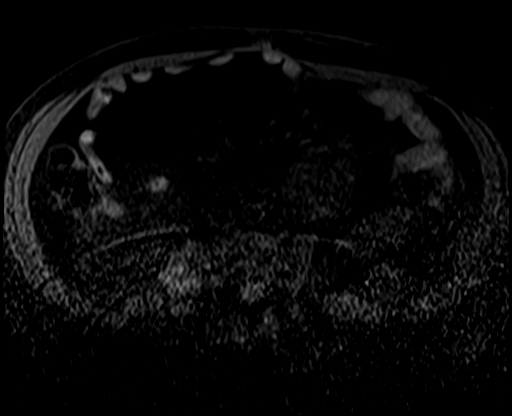
[im 32/64]
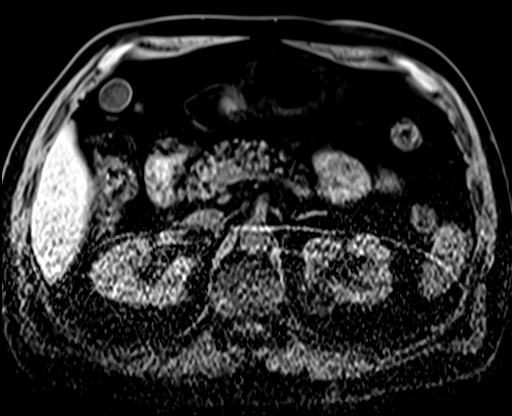
[im 64/64]
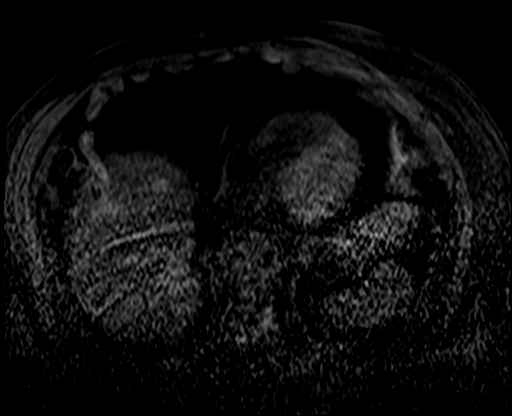

[Series 11: T1 dynamic fat-sat post-contrast · axial · 3.0mm · 0.74mm/px · z∈[-85,+104]mm · 3 of 64 slices shown (1 of 3)]
[im 1/64]
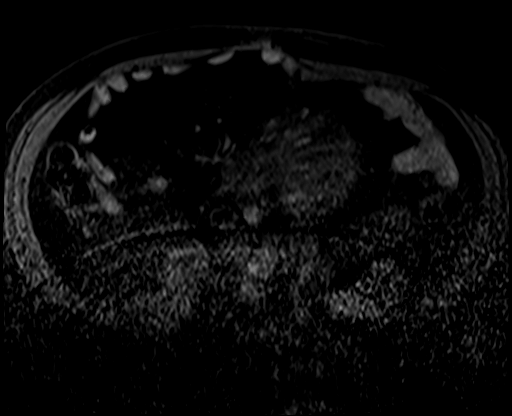
[im 32/64]
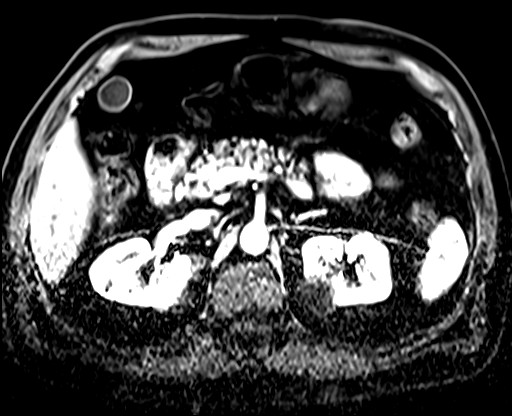
[im 64/64]
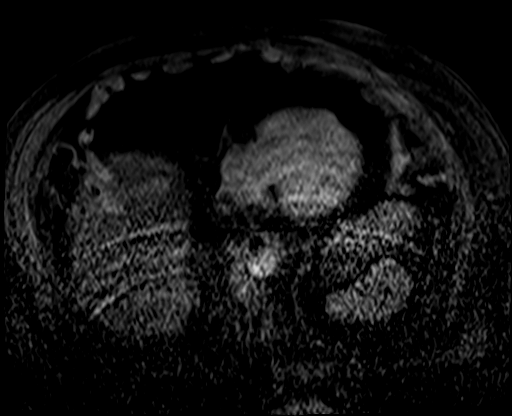

[Series 12: T1 dynamic fat-sat post-contrast · axial · 3.0mm · 0.74mm/px · z∈[-85,+104]mm · 3 of 64 slices shown (2 of 3)]
[im 1/64]
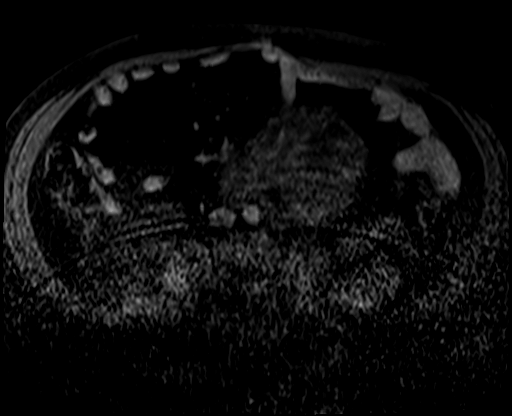
[im 32/64]
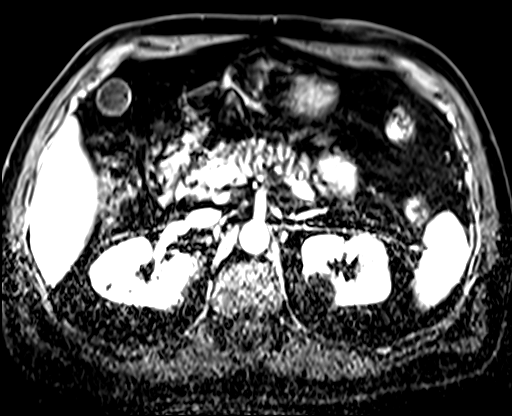
[im 64/64]
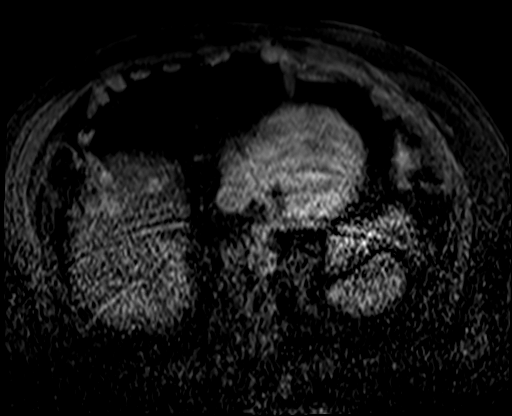

[Series 13: T1 dynamic fat-sat post-contrast · axial · 3.0mm · 0.74mm/px · z∈[-85,+104]mm · 3 of 64 slices shown (3 of 3)]
[im 1/64]
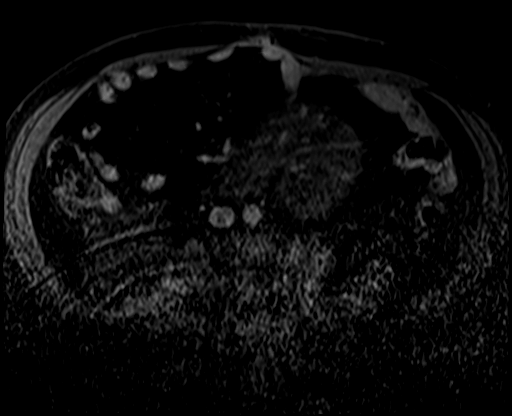
[im 32/64]
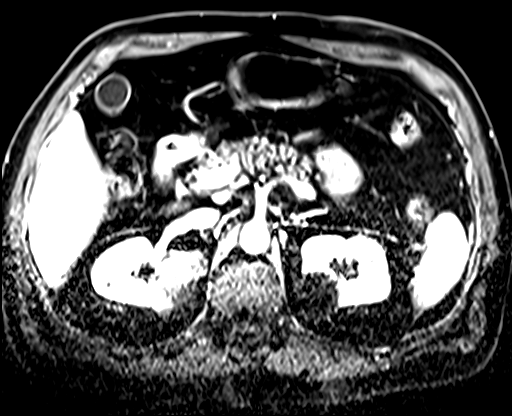
[im 64/64]
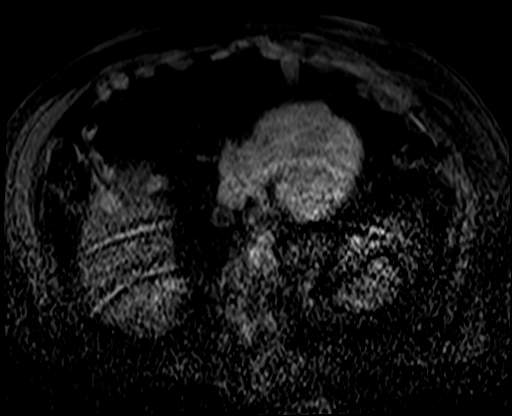

[28 of 48 positions shown; findings below may reference images not displayed]

FINDINGS: Lower chest: Unremarkable

Hepatobiliary: Tiny cysts in segment 2 and segment 8 of the liver
are observed. The gallbladder appears unremarkable.

Pancreas:  Unremarkable

Spleen:  Unremarkable

Adrenals/Urinary Tract:  Both adrenal glands appear normal.

Medially in the right mid to upper kidney a 2.5 by 2.4 by 2.0 cm
mass with low T2 and mixed precontrast T1 signal characteristics
(high medially but lower laterally) demonstrates internal and
marginal enhancement characteristic with a renal cell carcinoma.

Bilateral benign renal cysts are also present including a 5.7 cm
right kidney upper pole cyst and a 4.5 cm left kidney upper pole
cyst.

No appreciable tumor thrombus in the right renal vein.

Stomach/Bowel: Unremarkable

Vascular/Lymphatic: Aortoiliac atherosclerotic vascular disease. No
adenopathy identified.

Other:  No supplemental non-categorized findings.

Musculoskeletal: Unremarkable
IMPRESSION: 1. 2.5 cm enhancing mass in the right mid upper kidney most
compatible with renal cell carcinoma. T2 hypointensity suggest
papillary subtype. No tumor thrombus in the renal vein or adenopathy
identified.
2. Bilateral renal cysts.
3.  Aortic Atherosclerosis ([id number].X).

## 2024-05-05 ENCOUNTER — Other Ambulatory Visit: Payer: Self-pay

## 2024-05-05 ENCOUNTER — Emergency Department
Admission: EM | Admit: 2024-05-05 | Discharge: 2024-05-05 | Discharge status: Against Medical Advice | Source: Ambulatory Visit | Attending: Emergency Medicine | Admitting: Emergency Medicine

## 2024-05-05 DIAGNOSIS — Z5321 Procedure and treatment not carried out due to patient leaving prior to being seen by health care provider: Secondary | ICD-10-CM | POA: Diagnosis not present

## 2024-05-05 DIAGNOSIS — R42 Dizziness and giddiness: Secondary | ICD-10-CM | POA: Insufficient documentation

## 2024-05-05 LAB — URINALYSIS, ROUTINE W REFLEX MICROSCOPIC
Bilirubin Urine: NEGATIVE
Glucose, UA: NEGATIVE mg/dL
Hgb urine dipstick: NEGATIVE
Ketones, ur: NEGATIVE mg/dL
Leukocytes,Ua: NEGATIVE
Nitrite: NEGATIVE
Protein, ur: NEGATIVE mg/dL
Specific Gravity, Urine: 1.015 (ref 1.005–1.030)
pH: 6 (ref 5.0–8.0)

## 2024-05-05 LAB — COMPREHENSIVE METABOLIC PANEL WITH GFR
ALT: 15 U/L (ref 0–44)
AST: 20 U/L (ref 15–41)
Albumin: 3.9 g/dL (ref 3.5–5.0)
Alkaline Phosphatase: 48 U/L (ref 38–126)
Anion gap: 8 (ref 5–15)
BUN: 15 mg/dL (ref 8–23)
CO2: 25 mmol/L (ref 22–32)
Calcium: 8.7 mg/dL — ABNORMAL LOW (ref 8.9–10.3)
Chloride: 106 mmol/L (ref 98–111)
Creatinine, Ser: 1.24 mg/dL (ref 0.61–1.24)
GFR, Estimated: >60 mL/min (ref >60)
Glucose, Bld: 121 mg/dL — ABNORMAL HIGH (ref 70–99)
Potassium: 3.7 mmol/L (ref 3.5–5.1)
Sodium: 139 mmol/L (ref 135–145)
Total Bilirubin: 0.6 mg/dL (ref 0.0–1.2)
Total Protein: 6.8 g/dL (ref 6.5–8.1)

## 2024-05-05 LAB — CBC
HCT: 44.9 % (ref 39.0–52.0)
Hemoglobin: 15.2 g/dL (ref 13.0–17.0)
MCH: 31.3 pg (ref 26.0–34.0)
MCHC: 33.9 g/dL (ref 30.0–36.0)
MCV: 92.6 fL (ref 80.0–100.0)
Platelets: 175 K/uL (ref 150–400)
RBC: 4.85 MIL/uL (ref 4.22–5.81)
RDW: 12.4 % (ref 11.5–15.5)
WBC: 7.2 K/uL (ref 4.0–10.5)
nRBC: 0 % (ref 0.0–0.2)

## 2024-05-05 NOTE — ED Triage Notes (Signed)
 Patient sent over from [facility] for intermittent dizziness x a couple of weeks.

## 2024-09-07 ENCOUNTER — Ambulatory Visit
Admission: RE | Admit: 2024-09-07 | Discharge: 2024-09-07 | Disposition: A | Source: Ambulatory Visit | Attending: Student

## 2024-09-07 ENCOUNTER — Other Ambulatory Visit: Payer: Self-pay | Admitting: Student

## 2024-09-07 DIAGNOSIS — R053 Chronic cough: Secondary | ICD-10-CM
